# Patient Record
Sex: Male | Born: 1945 | Race: White | Hispanic: No | Marital: Married
Health system: Midwestern US, Community
[De-identification: ages and names within clinical notes are randomized; demographics above are authoritative.]

## PROBLEM LIST (undated history)

## (undated) DIAGNOSIS — K219 Gastro-esophageal reflux disease without esophagitis: Secondary | ICD-10-CM

## (undated) DIAGNOSIS — K2270 Barrett's esophagus without dysplasia: Secondary | ICD-10-CM

## (undated) DIAGNOSIS — Z8709 Personal history of other diseases of the respiratory system: Secondary | ICD-10-CM

## (undated) DIAGNOSIS — M1712 Unilateral primary osteoarthritis, left knee: Secondary | ICD-10-CM

## (undated) DIAGNOSIS — J40 Bronchitis, not specified as acute or chronic: Secondary | ICD-10-CM

## (undated) HISTORY — DX: Barrett's esophagus without dysplasia: K22.70

## (undated) HISTORY — PX: ESOPHAGOGASTRODUODENOSCOPY: SHX1529

## (undated) HISTORY — PX: HIP SURGERY: SHX245

## (undated) HISTORY — PX: COLONOSCOPY: SHX174

---

## 2001-07-28 ENCOUNTER — Encounter: Payer: Self-pay | Admitting: Orthopedic Surgery

## 2001-07-28 ENCOUNTER — Encounter: Admission: RE | Admit: 2001-07-28 | Discharge: 2001-07-28 | Payer: Self-pay | Admitting: Orthopedic Surgery

## 2001-11-19 HISTORY — PX: BACK SURGERY: SHX140

## 2002-07-27 ENCOUNTER — Observation Stay (HOSPITAL_COMMUNITY): Admission: RE | Admit: 2002-07-27 | Discharge: 2002-07-28 | Payer: Self-pay | Admitting: Orthopedic Surgery

## 2002-07-27 ENCOUNTER — Encounter: Payer: Self-pay | Admitting: Orthopedic Surgery

## 2002-07-28 ENCOUNTER — Encounter: Payer: Self-pay | Admitting: Orthopedic Surgery

## 2011-05-09 NOTE — Telephone Encounter (Signed)
Patient informed he may have Zostavax injection and script mailed to his home.

## 2011-08-16 NOTE — Patient Instructions (Signed)
Thank you for enrolling in MyChart. Please follow the instructions below to securely access your online medical record. MyChart allows you to send messages to your doctor, view your test results, renew your prescriptions, schedule appointments, and more.     How Do I Sign Up?  1. In your Internet browser, go to [email or web address]  2. Click on the Sign Up Now link in the Sign In box. You will see the New Member Sign Up page.  3. Enter your MyChart Access Code exactly as it appears below. You will not need to use this code after you've completed the sign-up process. If you do not sign up before the expiration date, you must request a new code.  MyChart Access Code: [email or web address]  Expires: 10/15/2011  9:22 AM    4. Enter your Social Security Number ([id number]) and Date of Birth (mm/dd/yyyy) as indicated and click Submit. You will be taken to the next sign-up page.  5. Create a MyChart ID. This will be your MyChart login ID and cannot be changed, so think of one that is secure and easy to remember.  6. Create a MyChart password. You can change your password at any time.  7. Enter your Password Reset Question and Answer. This can be used at a later time if you forget your password.   8. Enter your e-mail address. You will receive e-mail notification when new information is available in MyChart.  9. Click Sign Up. You can now view your medical record.     Additional Information  If you have questions, please contact your physician practice where you receive care. Remember, MyChart is NOT to be used for urgent needs. For medical emergencies, dial 911.

## 2011-08-16 NOTE — Progress Notes (Signed)
Subjective:      Patient ID: Wesley Holt is a 65 y.o. male.    HPIskin lesion left face for yr.  Wont heal    Review of Systems    Objective:   Physical Exam  ak of left face and one occiput, mult sk trunk  Assessment:      sk and ak      Plan:      Derm ref for whole body scan and rx of ak.  Avoid sun and hat etc, sun screen  Colon utd

## 2012-02-20 MED ORDER — PREDNISONE 20 MG PO TABS
20 MG | ORAL_TABLET | Freq: Every day | ORAL | Status: DC
Start: 2012-02-20 — End: 2012-12-19

## 2012-02-20 MED ORDER — ALBUTEROL SULFATE HFA 108 (90 BASE) MCG/ACT IN AERS
108 (90 Base) MCG/ACT | RESPIRATORY_TRACT | Status: DC | PRN
Start: 2012-02-20 — End: 2012-10-06

## 2012-02-20 NOTE — Progress Notes (Signed)
Subjective:      Patient ID: Wesley Holt is a 66 y.o. male.  Month hx  HPI    Review of Systems   Constitutional: Negative for fever and chills.   HENT: Positive for congestion, rhinorrhea and postnasal drip.    Respiratory: Positive for cough and wheezing. Negative for shortness of breath.    GERD is well controlled    Objective:   Physical Exam  HEENT is unremarkable supple lungs show x-ray wheeze with forced expiration no distress. Has multiple skin tags which are also  Assessment:        Bronchitis  GERD  Skin tags      Plan:      Cryo  See orders  Adacel  Requested old colonoscopy report N. date

## 2012-02-21 NOTE — Telephone Encounter (Signed)
[facility] received colonoscopy report  from dr Pattricia Boss from feb. 2005, and I  informed patient its due again feb. 2015.

## 2012-02-22 LAB — LIPID PANEL
Chol/HDL Ratio: 3.2
Cholesterol, Total: 188
HDL: 59 mg/dL (ref 35–70)
LDL Calculated: 117 mg/dL (ref 0–160)
Triglycerides: 59 mg/dL
VLDL: 12 mg/dL

## 2012-02-22 LAB — PSA SCREENING: PSA: 1.2 ng/mL

## 2012-02-28 NOTE — Telephone Encounter (Signed)
Informed wife labs good.

## 2012-03-06 NOTE — Telephone Encounter (Signed)
Changes made

## 2012-03-06 NOTE — Telephone Encounter (Addendum)
Please abstract to patient's chart he had Zostavax vaccine in November 2012 at [facility] on [address] in [place].  Thanks!

## 2012-10-07 MED ORDER — PROAIR HFA 108 (90 BASE) MCG/ACT IN AERS
108 (90 Base) MCG/ACT | RESPIRATORY_TRACT | Status: DC
Start: 2012-10-07 — End: 2013-03-11

## 2012-12-19 MED ORDER — PREDNISONE 20 MG PO TABS
20 MG | ORAL_TABLET | Freq: Every day | ORAL | Status: DC
Start: 2012-12-19 — End: 2013-03-13

## 2012-12-19 NOTE — Progress Notes (Signed)
Subjective:      Patient ID: Wesley Holt is a 67 y.o. male.    HPI Comments: Feels he has bronchitis. AM airway clearing,     Wheezing   Associated symptoms include coughing. Pertinent negatives include no chest pain, chills, diarrhea, ear pain, fever, neck pain, shortness of breath or sore throat.   Other  Associated symptoms include congestion and coughing. Pertinent negatives include no arthralgias, chest pain, chills, diaphoresis, fatigue, fever, nausea, neck pain or sore throat.       Review of Systems   Constitutional: Negative for fever, chills, diaphoresis and fatigue.   HENT: Positive for congestion and postnasal drip. Negative for ear pain, sore throat, trouble swallowing, neck pain, neck stiffness, voice change and sinus pressure.    Eyes: Negative for visual disturbance.   Respiratory: Positive for cough and wheezing. Negative for shortness of breath.    Cardiovascular: Negative for chest pain and leg swelling.   Gastrointestinal: Negative for nausea and diarrhea.   Musculoskeletal: Negative for back pain, arthralgias and gait problem.   Psychiatric/Behavioral: Positive for sleep disturbance.       Objective:   Physical Exam   Constitutional: He is oriented to person, place, and time. He appears well-developed and well-nourished. No distress.   HENT:   Head: Normocephalic and atraumatic.   Mouth/Throat: No oropharyngeal exudate.   Eyes: No scleral icterus.   Neck: Neck supple.   Cardiovascular: Exam reveals no gallop and no friction rub.    No murmur heard.  Pulmonary/Chest: Breath sounds normal. He exhibits no tenderness.   Musculoskeletal: He exhibits no edema and no tenderness.   Lymphadenopathy:     He has no cervical adenopathy.   Neurological: He is alert and oriented to person, place, and time. No cranial nerve deficit.   Skin: No rash noted. He is not diaphoretic. No erythema.   Psychiatric: He has a normal mood and affect. His behavior is normal. Judgment and thought content normal.        Assessment:      RAD       Plan:      Prednisone taper.

## 2012-12-19 NOTE — Progress Notes (Signed)
Have you seen any other physician or provider since your last visit? - no    Have you had any other diagnostic tests since your last visit? -  no    Have you changed or stopped any medications since your last visit including any over-the-counter medicines, vitamins, or herbal medicines? -  no     Are you taking all your prescribed medications? -  Yes  If NO, why? - N/A    Patient Self-Management Goal for this visit    What is your goal for your visit today: see if he has bronchitis   Barriers to success: none   Plan for overcoming my barriers: N/A      Confidence: 10/10   Date goal set: 12/19/2012   Date expected to reach goal:  today  HPI Notes

## 2012-12-19 NOTE — Addendum Note (Signed)
Addended by: Pamalee Leyden on: 12/19/2012 03:16 PM     Modules accepted: Orders

## 2013-03-11 MED ORDER — ALBUTEROL SULFATE HFA 108 (90 BASE) MCG/ACT IN AERS
108 (90 Base) MCG/ACT | RESPIRATORY_TRACT | Status: AC
Start: 2013-03-11 — End: ?

## 2013-03-13 ENCOUNTER — Encounter

## 2013-03-13 MED ORDER — PREDNISONE 20 MG PO TABS
20 MG | ORAL_TABLET | Freq: Every day | ORAL | Status: DC
Start: 2013-03-13 — End: 2014-08-27

## 2013-03-13 NOTE — Telephone Encounter (Signed)
Done,  Use proventil too

## 2013-03-13 NOTE — Telephone Encounter (Signed)
Message left on patient recorder

## 2013-03-13 NOTE — Telephone Encounter (Signed)
Chest congestion, bronchial irritation and tightness, shortness of breath, wheezing, coughing up and blowing out some clear mucus.  Symptoms started 3 weeks ago following an URI.  He had the same symptoms on 12-19-12 when he saw Dr. Smitty CordsBruce.  Prednisone was prescribed then and it really helped him.  Can he get a refill on this?    [facility]

## 2013-03-16 MED ORDER — DOXYCYCLINE HYCLATE 100 MG PO TABS
100 MG | ORAL_TABLET | Freq: Two times a day (BID) | ORAL | Status: AC
Start: 2013-03-16 — End: 2013-03-26

## 2013-03-16 NOTE — Progress Notes (Signed)
Subjective:      Patient ID: Wesley Holt is a 67 y.o. male.    Other  This is a new problem. The current episode started 1 to 4 weeks ago. The problem occurs constantly. Associated symptoms include congestion and coughing. Pertinent negatives include no chills or fever. Treatments tried: pred. The treatment provided mild relief.       Review of Systems   Constitutional: Negative for fever and chills.   HENT: Positive for congestion, rhinorrhea and postnasal drip.    Respiratory: Positive for cough, shortness of breath and wheezing.    Gastrointestinal: Negative.        Objective:   Physical Exam   Constitutional: He appears well-nourished.   HENT:   Head: Normocephalic and atraumatic.   Right Ear: External ear normal.   Left Ear: External ear normal.   Mouth/Throat: Oropharynx is clear and moist. No oropharyngeal exudate.   Neck: Neck supple.   Cardiovascular: Normal rate, normal heart sounds and intact distal pulses.  Exam reveals no gallop.    No murmur heard.  Pulmonary/Chest: Effort normal and breath sounds normal. No respiratory distress. He has no wheezes. He has no rales. He exhibits no tenderness.   Musculoskeletal: He exhibits no edema and no tenderness.       Assessment:      Asthmatic bronchitis       Plan:      Chest x-ray see orders  Because of recurrent episodes referred for allergy consult, f/ut with me as needed

## 2013-03-16 NOTE — Progress Notes (Signed)
Have you seen any other physician or provider since your last visit no    Have you had any other diagnostic tests since your last visit? no    Have you changed or stopped any medications since your last visit including any over-the-counter medicines, vitamins, or herbal medicines? no     Are you taking all your prescribed medications? Yes  If NO, why? -  N/A           Patient Self-Management Goal for this visit.   What is your goal for your visit today? - feel better   Barriers to success: none   Plan for overcoming my barriers: N/A      Confidence: 8/10   Date goal set: 03/16/2013   Date expected to reach goal: unsure    Health Maintenance Due   Topic Date Due   ??? Psa Counseling  02/21/2013

## 2013-03-17 ENCOUNTER — Encounter

## 2014-08-27 ENCOUNTER — Ambulatory Visit: Admit: 2014-08-27 | Discharge: 2014-08-27 | Payer: PRIVATE HEALTH INSURANCE | Attending: Family Medicine

## 2014-08-27 DIAGNOSIS — H1131 Conjunctival hemorrhage, right eye: Secondary | ICD-10-CM

## 2014-08-27 NOTE — Progress Notes (Signed)
Subjective:      Patient ID: Wesley HugueninRobert Matthew Holt is a 68 y.o. male.    HPI Comments: Have you seen any other physician or provider since your last visit no    Have you had any other diagnostic tests since your last visit? no    Have you changed or stopped any medications since your last visit including any over-the-counter medicines, vitamins, or herbal medicines? no     Are you taking all your prescribed medications? Yes  If NO, why? -  N/A           Patient Self-Management Goal for this visit.   What is your goal for your visit today? - sx relief   Barriers to success: none   Plan for overcoming my barriers: N/A      Confidence: 10/10   Date goal set: 08/27/14   Date expected to reach goal: 1day    No past medical history on file.  No past surgical history on file.  No family history on file.      Smoking Status: Former Smoker                   Packs/Day: 0.00  Years:            Quit date: 04/20/1973    Smokeless Status: Never Used                        Alcohol Use: Not on file     Current Outpatient Prescriptions:  albuterol (PROAIR HFA) 108 (90 BASE) MCG/ACT inhaler, INHALE TWO PUFFS BY MOUTH EVERY 4 HOURS AS NEEDED FOR WHEEZING, Disp: 8.5 g, Rfl: 5  Omeprazole Magnesium (PRILOSEC OTC PO), Take 20 mg by mouth daily.  , Disp: , Rfl:     No current facility-administered medications for this visit.    No Known Allergies      Pt presents with concerns over red OD.      Eye Problem   Associated symptoms include eye redness. Pertinent negatives include no eye discharge, fever or weakness.   Headache   Associated symptoms include eye redness. Pertinent negatives include no abdominal pain, back pain, coughing, eye pain, fever, hearing loss, neck pain, numbness or weakness.       Review of Systems   Constitutional: Negative for fever, chills, diaphoresis and fatigue.   HENT: Negative for congestion and hearing loss.    Eyes: Positive for redness. Negative for pain, discharge, itching and visual disturbance.    Respiratory: Negative for cough, shortness of breath and wheezing.    Cardiovascular: Negative for chest pain, palpitations and leg swelling.   Gastrointestinal: Negative for abdominal pain, diarrhea, constipation and blood in stool.   Genitourinary: Negative for dysuria.   Musculoskeletal: Negative for back pain, arthralgias, gait problem and neck pain.   Skin: Negative for rash.   Neurological: Positive for headaches. Negative for weakness and numbness.   Psychiatric/Behavioral: Negative for sleep disturbance and dysphoric mood.       Objective:   Physical Exam   Constitutional: He is oriented to person, place, and time. He appears well-developed and well-nourished. No distress.   HENT:   Head: Normocephalic and atraumatic.   Mouth/Throat: No oropharyngeal exudate.   Eyes: Right eye exhibits no discharge. Left eye exhibits no discharge. Right conjunctiva is not injected. Right conjunctiva has a hemorrhage. Left conjunctiva is not injected. Left conjunctiva has no hemorrhage. No scleral icterus.   Neck: Neck supple. Carotid  bruit is not present. No thyromegaly present.   Cardiovascular: Normal rate, regular rhythm and normal heart sounds.  Exam reveals no gallop and no friction rub.    No murmur heard.  Pulmonary/Chest: Breath sounds normal. No respiratory distress. He has no wheezes. He has no rales. He exhibits no tenderness.   Abdominal: There is no tenderness.   Musculoskeletal: He exhibits no edema or tenderness.   Lymphadenopathy:     He has no cervical adenopathy.   Neurological: He is alert and oriented to person, place, and time. No cranial nerve deficit. Coordination normal.   Skin: No rash noted. He is not diaphoretic.   Psychiatric: He has a normal mood and affect. His behavior is normal. Judgment and thought content normal.       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       Plan:      Reassurance  F/U prn.

## 2014-09-16 ENCOUNTER — Ambulatory Visit (INDEPENDENT_AMBULATORY_CARE_PROVIDER_SITE_OTHER): Payer: No Typology Code available for payment source

## 2014-09-16 VITALS — BP 135/96 | HR 92 | Resp 12

## 2014-09-16 DIAGNOSIS — M7750 Other enthesopathy of unspecified foot: Secondary | ICD-10-CM

## 2014-09-16 DIAGNOSIS — B351 Tinea unguium: Secondary | ICD-10-CM

## 2014-09-16 DIAGNOSIS — Q828 Other specified congenital malformations of skin: Secondary | ICD-10-CM

## 2014-09-16 DIAGNOSIS — M79673 Pain in unspecified foot: Secondary | ICD-10-CM

## 2014-09-16 DIAGNOSIS — M2040 Other hammer toe(s) (acquired), unspecified foot: Secondary | ICD-10-CM

## 2014-09-16 DIAGNOSIS — M779 Enthesopathy, unspecified: Secondary | ICD-10-CM

## 2014-09-16 DIAGNOSIS — M778 Other enthesopathies, not elsewhere classified: Secondary | ICD-10-CM

## 2014-09-16 DIAGNOSIS — M2010 Hallux valgus (acquired), unspecified foot: Secondary | ICD-10-CM

## 2014-09-16 DIAGNOSIS — B353 Tinea pedis: Secondary | ICD-10-CM

## 2014-09-16 MED ORDER — KETOCONAZOLE 2 % EX CREA: 1.0000 "application " | TOPICAL_CREAM | Freq: Two times a day (BID) | CUTANEOUS | Status: DC

## 2014-09-16 NOTE — Progress Notes (Signed)
Subjective:    Patient ID: Carolyne LittlesRobert H Whitsell, male    DOB: Apr 04, 1946, 68 y.o.   MRN: [id number]  HPI PT STATED RT 2ND TOE IS SORE, HAVE THICK SKIN AND BEEN LIKE THAT FOR 15 YEARS. THE TOE IS GETTING WORSE AND GET AGGRAVATED BY PRESSURE.  TRIED NO TREATMENT.  ALSO, RT BOTTOM OF THE FOOT HAVE CALLUS.   Review of Systems  Musculoskeletal: Positive for gait problem.  All other systems reviewed and are negative.      Objective:   Physical Exam 68 year old white male well-developed well-nourished oriented 3 presents at this time with painful skin lesions particularly distal second digit right foot and subsecond MTP area right foot been there for many years has severe rigid contractures of toes 1 through 5 bilateral right more severe than left with notable HAV deformity and hallux malleus deformity as well as claw toe/hammertoe type deformities 2 through 5 bilateral right more so than left. No handwritten history of injury trauma patient's father also had severe deformities of both feet. Patient does have hemorrhagic keratoses distal clavus second right and hemorrhage keratoses subsecond MTP plantarly on the right. Patient also has diffuse keratoses sub-1 and 5 bilateral secondary to HAV deformity and digital contractures. Lower extremity objective findings as follows vascular status is intact pedal pulses palpable DP +2 over 4 bilateral PT 1 over 4 bilateral mild +1 edema noted minimal or no varicosity decreased hair growth distally although intact to the toes. Skin temperature warm turgor normal no edema or mild edema noted no rubor or pallor or varicosity noted neurologic epicritic and proprioceptive sensations intact and symmetric bilateral there is normal plantar response DTRs not listed dermatologic the skin color pigment normal hair growth diminished distally nails criptotic friable discolored yellow and brittle 1 through 5 bilateral with thickening discoloration friability also interdigital  fissuring maceration second third and fourth interspaces both feet there is history of peripheral pruritus and some fissuring noted patient also has some oxygen distribution of tinea right foot more so than left consistent with chronic tinea pedis as well as the fungal discussed discoloration of nails. Orthopedic biomechanical exam again reveals relatively rectus to higher arch foot type with claw toe contractures rigid of the toes as well as hallux abductovalgus deformity and bunion deformity with rigid contracture of the hallux as well. Comminuted shoes does work in on his feet does not driving and standing at times and indicates he has been putting this off for a long time. Remainder the exam is unremarkable and noncontributory does have a history of back surgery 10 years ago however that is this foot problems were present prior to the back issue. 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 May also be candidate for future therapies and treatments however we'll initiate continue  treatment at this time. Maintaining appropriate comminuted shoes patient is advised that surgery would be required to correct the deformities of the foot and teasing certainly has some arthrosis and some  stiffness and abnormal function this would not restore him to a normal foot however more structurally stable foot would be beneficial. Follow-up in one month for further consult   f Alvan Dameichard Sheela Mcculley DPM

## 2014-09-16 NOTE — Patient Instructions (Signed)
Athlete's Foot Athlete's foot (tinea pedis) is a fungal infection of the skin on the feet. It often occurs on the skin between the toes or underneath the toes. It can also occur on the soles of the feet. Athlete's foot is more likely to occur in hot, humid weather. Not washing your feet or changing your socks often enough can contribute to athlete's foot. The infection can spread from person to person (contagious). CAUSES Athlete's foot is caused by a fungus. This fungus thrives in warm, moist places. Most people get athlete's foot by sharing shower stalls, towels, and wet floors with an infected person. People with weakened immune systems, including those with diabetes, may be more likely to get athlete's foot. SYMPTOMS   Itchy areas between the toes or on the soles of the feet.  White, flaky, or scaly areas between the toes or on the soles of the feet.  Tiny, intensely itchy blisters between the toes or on the soles of the feet.  Tiny cuts on the skin. These cuts can develop a bacterial infection.  Thick or discolored toenails. DIAGNOSIS  Your caregiver can usually tell what the problem is by doing a physical exam. Your caregiver may also take a skin sample from the rash area. The skin sample may be examined under a microscope, or it may be tested to see if fungus will grow in the sample. A sample may also be taken from your toenail for testing. TREATMENT  Over-the-counter and prescription medicines can be used to kill the fungus. These medicines are available as powders or creams. Your caregiver can suggest medicines for you. Fungal infections respond slowly to treatment. You may need to continue using your medicine for several weeks. PREVENTION   Do not share towels.  Wear sandals in wet areas, such as shared locker rooms and shared showers.  Keep your feet dry. Wear shoes that allow air to circulate. Wear cotton or wool socks. HOME CARE INSTRUCTIONS   Take medicines as directed by  your caregiver. Do not use steroid creams on athlete's foot.  Keep your feet clean and cool. Wash your feet daily and dry them thoroughly, especially between your toes.  Change your socks every day. Wear cotton or wool socks. In hot climates, you may need to change your socks 2 to 3 times per day.  Wear sandals or canvas tennis shoes with good air circulation.  If you have blisters, soak your feet in Burow's solution or Epsom salts for 20 to 30 minutes, 2 times a day to dry out the blisters. Make sure you dry your feet thoroughly afterward. SEEK MEDICAL CARE IF:   You have a fever.  You have swelling, soreness, warmth, or redness in your foot.  You are not getting better after 7 days of treatment.  You are not completely cured after 30 days.  You have any problems caused by your medicines. MAKE SURE YOU:   Understand these instructions.  Will watch your condition.  Will get help right away if you are not doing well or get worse. Document Released: 11/02/2000 Document Revised: 01/28/2012 Document Reviewed: 08/24/2011 [facility] Patient Information 2015 [employer] [place]. This information is not intended to replace advice given to you by your health care provider. Make sure you discuss any questions you have with your health care provider.  Apply the antifungal cream that was prescribed between all the toes and the entire bottom surface of the foot bilateral or both feet twice daily for at least 1 month

## 2014-10-11 ENCOUNTER — Ambulatory Visit (INDEPENDENT_AMBULATORY_CARE_PROVIDER_SITE_OTHER): Payer: No Typology Code available for payment source

## 2014-10-11 VITALS — BP 148/87 | HR 70 | Resp 12

## 2014-10-11 DIAGNOSIS — M779 Enthesopathy, unspecified: Secondary | ICD-10-CM

## 2014-10-11 DIAGNOSIS — M2010 Hallux valgus (acquired), unspecified foot: Secondary | ICD-10-CM

## 2014-10-11 DIAGNOSIS — Q828 Other specified congenital malformations of skin: Secondary | ICD-10-CM

## 2014-10-11 DIAGNOSIS — M7750 Other enthesopathy of unspecified foot: Secondary | ICD-10-CM

## 2014-10-11 DIAGNOSIS — M2040 Other hammer toe(s) (acquired), unspecified foot: Secondary | ICD-10-CM

## 2014-10-11 DIAGNOSIS — M778 Other enthesopathies, not elsewhere classified: Secondary | ICD-10-CM

## 2014-10-11 DIAGNOSIS — M79673 Pain in unspecified foot: Secondary | ICD-10-CM

## 2014-10-11 NOTE — Patient Instructions (Signed)
Bunionectomy A bunionectomy is surgery to remove a bunion. A bunion is an enlargement of the joint at the base of the big toe. It is made up of bone and soft tissue on the inside part of the joint. Over time, a painful lump appears on the inside of the joint. The big toe begins to point inward toward the second toe. New bone growth can occur and a bone spur may form. The pain eventually causes difficulty walking. A bunion usually results from inflammation caused by the irritation of poorly fitting shoes. It often begins later in life. A bunionectomy is performed when nonsurgical treatment no longer works. When surgery is needed, the extent of the procedure will depend on the degree of deformity of the foot. Your surgeon will discuss with you the different procedures and what will work best for you depending on your age and health. LET YOUR CAREGIVER KNOW ABOUT:   Previous problems with anesthetics or medicines used to numb the skin.  Allergies to dyes, iodine, foods, and/or latex.  Medicines taken including herbs, eye drops, prescription medicines (especially medicines used to "thin the blood"), aspirin and other over-the-counter medicines, and steroids (by mouth or as a cream).  History of bleeding or blood problems.  Possibility of pregnancy, if this applies.  History of blood clots in your legs and/or lungs .  Previous surgery.  Other important health problems. RISKS AND COMPLICATIONS   Infection.  Pain.  Nerve damage.  Possibility that the bunion will recur. BEFORE THE PROCEDURE  You should be present 60 minutes prior to your procedure or as directed.  PROCEDURE  Surgery is often done so that you can go home the same day (outpatient). It may be done in a hospital or in an outpatient surgical center. An anesthetic will be used to help you sleep during the procedure. Sometimes, a spinal anesthetic is used to make you numb below the waist. A cut (incision) is made over the swollen  area at the first joint of the big toe. The enlarged lump will be removed. If there is a need to reposition the bones of the big toe, this may require more than 1 incision. The bone itself may need to be cut. Screws and wires may be used in the repair. These can be removed at a later date. In severe cases, the entire joint may need to be removed and a joint replacement inserted. When done, the incision is closed with stitches (sutures). Skin adhesive strips may be added for reinforcement. They help hold the incision closed.  AFTER THE PROCEDURE  Compression bandages (dressings) are then wrapped around the wound. This helps to keep the foot in alignment and reduce swelling. Your foot will be monitored for bleeding and swelling. You will need to stay for a few hours in the recovery area before being discharged. This allows time for the anesthesia to wear off. You will be discharged home when you are awake, stable, and doing well. HOME CARE INSTRUCTIONS   You can expect to return to normal activities within 6 to 8 weeks after surgery. The foot is at increased risk for swelling for several months. When you can expect to bear weight on the operated foot will depend on the extent of your surgery. The milder the deformity, the less tissue is removed and the sooner the return to normal activity level. During the recovery period, a special shoe, boot, or cast may be worn to accommodate the surgical bandage and to help provide stability   to the foot.  Once you are home, an ice pack applied to the operative site may help with discomfort and keep swelling down. Stop using the ice if it causes discomfort.  Keep your feet raised (elevated) when possible to lessen swelling.  If you have an elastic bandage on your foot and you have numbness, tingling, or your foot becomes cold and blue, adjust the bandage to make it comfortable.  Change dressings as directed.  Keep the wound dry and clean. The wound may be washed  gently with soap and water. Gently blot dry without rubbing. Do not take baths or use swimming pools or hot tubs for 10 days, or as instructed by your caregiver.  Only take over-the-counter or prescription medicines for pain, discomfort, or fever as directed by your caregiver.  You may continue a normal diet as directed.  For activity, use crutches with no weight bearing or your orthopedic shoe as directed. Continue to use crutches or a cane as directed until you can stand without causing pain. SEEK MEDICAL CARE IF:   You have redness, swelling, bruising, or increasing pain in the wound.  There is pus coming from the wound.  You have drainage from a wound lasting longer than 1 day.  You have an oral temperature above 102 F (38.9 C).  You notice a bad smell coming from the wound or dressing.  The wound breaks open after sutures have been removed.  You develop dizzy episodes or fainting while standing.  You have persistent nausea or vomiting.  Your toes become cold.  Pain is not relieved with medicines. SEEK IMMEDIATE MEDICAL CARE IF:   You develop a rash.  You have difficulty breathing.  You develop any reaction or side effects to medicines given.  Your toes are numb or blue, or you have severe pain. MAKE SURE YOU:   Understand these instructions.  Will watch your condition.  Will get help right away if you are not doing well or get worse. Document Released: 10/19/2005 Document Revised: 01/28/2012 Document Reviewed: 11/24/2007 ExitCare Patient Information 2015 [employer]. This information is not intended to replace advice given to you by your health care provider. Make sure you discuss any questions you have with your health care provider.  

## 2014-10-11 NOTE — Progress Notes (Signed)
   Subjective:    Patient ID: Nicholas LittlesRobert H Shimmin, male    DOB: [birth year], 68 y.o.   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 5 there is keratoses subsecond right which is debrided at this time no open wound ulcer site punctate keratoses identified with dried hemorrhage a keratoses noted patient also has distal clavus second right which is resolved no open wounds no ulcers no signs of infection patient's had improvement with the use of the tube foam pads cushions this time dispensed literature about surgical intervention for bunion and hammertoe repair patient will consider this at some point in the future would be good candidate for likely bunionectomy possible hallux IP fusion as well as hammertoe repairs 2 through 5. We briefly discussed the risks complications of surgery the risks of not doing surgery with further breakdown and difficulties with gait and ambulation. 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 Dispensed literature about surgical options will follow-up in the future on an as-needed basis for surgery consult when ready. In the interim maintain accommodative thick soled shoes such as new balance or Brooks. Dispensed additional tube foam padding for the second toe at this time. Follow-up in the future when ready to consider surgical option  Alvan Dameichard Levon Penning DPM

## 2016-01-05 DIAGNOSIS — K219 Gastro-esophageal reflux disease without esophagitis: Secondary | ICD-10-CM | POA: Diagnosis not present

## 2016-01-05 DIAGNOSIS — K2270 Barrett's esophagus without dysplasia: Secondary | ICD-10-CM | POA: Diagnosis not present

## 2016-01-25 ENCOUNTER — Other Ambulatory Visit: Payer: Self-pay

## 2016-01-25 DIAGNOSIS — K2900 Acute gastritis without bleeding: Secondary | ICD-10-CM | POA: Diagnosis not present

## 2016-01-25 DIAGNOSIS — K2950 Unspecified chronic gastritis without bleeding: Secondary | ICD-10-CM | POA: Diagnosis not present

## 2016-01-25 DIAGNOSIS — K2270 Barrett's esophagus without dysplasia: Secondary | ICD-10-CM | POA: Diagnosis not present

## 2016-01-25 DIAGNOSIS — Z791 Long term (current) use of non-steroidal anti-inflammatories (NSAID): Secondary | ICD-10-CM | POA: Diagnosis not present

## 2016-04-30 DIAGNOSIS — Z0000 Encounter for general adult medical examination without abnormal findings: Secondary | ICD-10-CM | POA: Diagnosis not present

## 2016-05-07 DIAGNOSIS — Z0000 Encounter for general adult medical examination without abnormal findings: Secondary | ICD-10-CM | POA: Diagnosis not present

## 2016-05-07 DIAGNOSIS — D519 Vitamin B12 deficiency anemia, unspecified: Secondary | ICD-10-CM | POA: Diagnosis not present

## 2016-06-06 DIAGNOSIS — D510 Vitamin B12 deficiency anemia due to intrinsic factor deficiency: Secondary | ICD-10-CM | POA: Diagnosis not present

## 2016-08-02 DIAGNOSIS — M25462 Effusion, left knee: Secondary | ICD-10-CM | POA: Diagnosis not present

## 2016-08-02 DIAGNOSIS — M1712 Unilateral primary osteoarthritis, left knee: Secondary | ICD-10-CM | POA: Diagnosis not present

## 2016-08-02 DIAGNOSIS — M25561 Pain in right knee: Secondary | ICD-10-CM | POA: Diagnosis not present

## 2016-08-02 DIAGNOSIS — M25562 Pain in left knee: Secondary | ICD-10-CM | POA: Diagnosis not present

## 2016-08-02 DIAGNOSIS — M85862 Other specified disorders of bone density and structure, left lower leg: Secondary | ICD-10-CM | POA: Diagnosis not present

## 2016-08-02 DIAGNOSIS — G8929 Other chronic pain: Secondary | ICD-10-CM | POA: Diagnosis not present

## 2017-06-06 DIAGNOSIS — Z6838 Body mass index (BMI) 38.0-38.9, adult: Secondary | ICD-10-CM | POA: Diagnosis not present

## 2017-06-06 DIAGNOSIS — Z9181 History of falling: Secondary | ICD-10-CM | POA: Diagnosis not present

## 2017-06-06 DIAGNOSIS — Z1389 Encounter for screening for other disorder: Secondary | ICD-10-CM | POA: Diagnosis not present

## 2017-06-06 DIAGNOSIS — Z0000 Encounter for general adult medical examination without abnormal findings: Secondary | ICD-10-CM | POA: Diagnosis not present

## 2017-06-06 DIAGNOSIS — Z79899 Other long term (current) drug therapy: Secondary | ICD-10-CM | POA: Diagnosis not present

## 2017-06-06 DIAGNOSIS — E785 Hyperlipidemia, unspecified: Secondary | ICD-10-CM | POA: Diagnosis not present

## 2017-06-06 DIAGNOSIS — D519 Vitamin B12 deficiency anemia, unspecified: Secondary | ICD-10-CM | POA: Diagnosis not present

## 2017-07-10 DIAGNOSIS — D510 Vitamin B12 deficiency anemia due to intrinsic factor deficiency: Secondary | ICD-10-CM | POA: Diagnosis not present

## 2017-08-13 DIAGNOSIS — D510 Vitamin B12 deficiency anemia due to intrinsic factor deficiency: Secondary | ICD-10-CM | POA: Diagnosis not present

## 2017-08-15 DIAGNOSIS — M25561 Pain in right knee: Secondary | ICD-10-CM

## 2017-08-15 DIAGNOSIS — M170 Bilateral primary osteoarthritis of knee: Secondary | ICD-10-CM | POA: Diagnosis not present

## 2017-08-15 DIAGNOSIS — G8929 Other chronic pain: Secondary | ICD-10-CM | POA: Diagnosis not present

## 2017-08-15 DIAGNOSIS — M25562 Pain in left knee: Secondary | ICD-10-CM

## 2017-08-28 DIAGNOSIS — R0602 Shortness of breath: Secondary | ICD-10-CM

## 2017-08-28 DIAGNOSIS — I10 Essential (primary) hypertension: Secondary | ICD-10-CM

## 2017-08-28 DIAGNOSIS — I951 Orthostatic hypotension: Secondary | ICD-10-CM

## 2017-08-28 DIAGNOSIS — E7800 Pure hypercholesterolemia, unspecified: Secondary | ICD-10-CM

## 2017-08-28 DIAGNOSIS — R002 Palpitations: Secondary | ICD-10-CM

## 2017-08-28 DIAGNOSIS — R42 Dizziness and giddiness: Secondary | ICD-10-CM

## 2017-08-28 DIAGNOSIS — K22719 Barrett's esophagus with dysplasia, unspecified: Secondary | ICD-10-CM

## 2017-08-28 DIAGNOSIS — D510 Vitamin B12 deficiency anemia due to intrinsic factor deficiency: Secondary | ICD-10-CM

## 2017-08-29 DIAGNOSIS — K2270 Barrett's esophagus without dysplasia: Secondary | ICD-10-CM | POA: Insufficient documentation

## 2017-08-29 DIAGNOSIS — E7800 Pure hypercholesterolemia, unspecified: Secondary | ICD-10-CM | POA: Insufficient documentation

## 2017-08-29 DIAGNOSIS — D510 Vitamin B12 deficiency anemia due to intrinsic factor deficiency: Secondary | ICD-10-CM | POA: Insufficient documentation

## 2017-08-30 ENCOUNTER — Ambulatory Visit (INDEPENDENT_AMBULATORY_CARE_PROVIDER_SITE_OTHER): Payer: PPO | Admitting: Cardiology

## 2017-08-30 ENCOUNTER — Encounter: Payer: Self-pay | Admitting: Cardiology

## 2017-08-30 VITALS — BP 126/84 | HR 56 | Resp 14 | Ht 76.0 in | Wt 312.8 lb

## 2017-08-30 DIAGNOSIS — R079 Chest pain, unspecified: Secondary | ICD-10-CM | POA: Diagnosis not present

## 2017-08-30 DIAGNOSIS — E785 Hyperlipidemia, unspecified: Secondary | ICD-10-CM

## 2017-08-30 DIAGNOSIS — R0789 Other chest pain: Secondary | ICD-10-CM

## 2017-08-30 DIAGNOSIS — D510 Vitamin B12 deficiency anemia due to intrinsic factor deficiency: Secondary | ICD-10-CM | POA: Diagnosis not present

## 2017-08-30 NOTE — Progress Notes (Signed)
Cardiology Consultation:    Date:  08/30/2017   ID:  Nicholas Rowland, DOB May 02, 1946, MRN [id number]  PCP:  Noni Saupe, MD  Cardiologist:  Gypsy Balsam, MD   Referring MD: Noni Saupe, MD   Chief Complaint  Patient presents with  . Pre-op Exam  I need knee replacement surgery  History of Present Illness:    Nicholas Rowland is a 71 y.o. male who is being seen today for the evaluation of Multiple risk factors for coronary artery disease at the request of Noni Saupe, MD. Patient required left knee replacement surgery. He does have multiple resources for coronary artery disease namely elevated cholesterol, he refused anticholesterol medication, borderline hypertension, also recent laboratory tests show elevated glucose he's telling me that he was fasting. He does have history of smoking. He is a bit of exercise is limited because of knee pain. Denies having any typical tightness squeezing pressure burning chest assisted with exertion but does have some uneasy sensation sometimes lasting for few minutes.  Past Medical History:  Diagnosis Date  . Barrett esophagus     Past Surgical History:  Procedure Laterality Date  . BACK SURGERY      Current Medications: Current Meds  Medication Sig  . pantoprazole (PROTONIX) 40 MG tablet Take 40 mg by mouth daily.     Allergies:   Patient has no known allergies.   Social History   Social History  . Marital status: Married    Spouse name: N/A  . Number of children: N/A  . Years of education: N/A   Social History Main Topics  . Smoking status: Former Games developer  . Smokeless tobacco: Never Used  . Alcohol use No  . Drug use: No  . Sexual activity: Not Asked   Other Topics Concern  . None   Social History Narrative  . None     Family History: The patient's family history includes Barrett's esophagus in his brother; Breast cancer in his mother; Esophageal cancer in his brother. ROS:   Please see the  history of present illness.    All 14 point review of systems negative except as described per history of present illness.  EKGs/Labs/Other Studies Reviewed:    The following studies were reviewed today:  EKG from primary care physician showed normal sinus rhythm, normal. Interval, normal [employer] exertional Foley, no ST-T segment changes   Recent Labs: No results found for requested labs within last 8760 hours.  Recent Lipid Panel No results found for: CHOL, TRIG, HDL, CHOLHDL, VLDL, LDLCALC, LDLDIRECT  Physical Exam:    VS:  BP 126/84   Pulse (!) 56   Resp 14   Ht  (1.93 m)   Wt (!) 312 lb 12.8 oz (141.9 kg)   BMI 38.08 kg/m     Wt Readings from Last 3 Encounters:  08/30/17 (!) 312 lb 12.8 oz (141.9 kg)     GEN:  Well nourished, well developed in no acute distress HEENT: Normal NECK: No JVD; No carotid bruits LYMPHATICS: No lymphadenopathy CARDIAC: RRR, no murmurs, no rubs, no gallops RESPIRATORY:  Clear to auscultation without rales, wheezing or rhonchi  ABDOMEN: Soft, non-tender, non-distended MUSCULOSKELETAL:  No edema; No deformity  SKIN: Warm and dry NEUROLOGIC:  Alert and oriented x 3 PSYCHIATRIC:  Normal affect   ASSESSMENT:    1. Pernicious anemia   2. Atypical chest pain   3. Dyslipidemia    PLAN:    In  order of problems listed above:  1. Atypical chest pain, preop evaluation for knee replacement surgery under general anesthesia: He will require stress doses of success is risks. His ability to exercise is very limited, he does have significant risk factors for coronary artery disease. 2. Dyslipidemia: I offered him stopped and he declined. He said that he would like to lower his cholesterol on his own. I told him that this is a good idea. I also mentioned to him I doubt he will be able to get the target. Future recommendation will be made based on results of stress test. 3. Anemia: Followed by primary care physician.   Medication  Adjustments/Labs and Tests Ordered: Current medicines are reviewed at length with the patient today.  Concerns regarding medicines are outlined above.  No orders of the defined types were placed in this encounter.  No orders of the defined types were placed in this encounter.   Signed, Georgeanna Lea, MD, [facility]. 08/30/2017 11:31 AM    [facility]

## 2017-08-30 NOTE — Patient Instructions (Signed)
Medication Instructions:  Your physician recommends that you continue on your current medications as directed. Please refer to the Current Medication list given to you today.  Labwork: None  Testing/Procedures: Your physician has requested that you have a lexiscan myoview. For further information please visit [email or web address]. Please follow instruction sheet, as given.  Follow-Up: Your physician recommends that you schedule a follow-up appointment in: 3 months.  Any Other Special Instructions Will Be Listed Below (If Applicable).     If you need a refill on your cardiac medications before your next appointment, please call your pharmacy.   

## 2017-09-04 DIAGNOSIS — R0789 Other chest pain: Secondary | ICD-10-CM | POA: Diagnosis not present

## 2017-09-05 DIAGNOSIS — R0789 Other chest pain: Secondary | ICD-10-CM | POA: Diagnosis not present

## 2017-09-05 DIAGNOSIS — R079 Chest pain, unspecified: Secondary | ICD-10-CM | POA: Diagnosis not present

## 2017-09-10 DIAGNOSIS — D510 Vitamin B12 deficiency anemia due to intrinsic factor deficiency: Secondary | ICD-10-CM | POA: Diagnosis not present

## 2017-09-12 ENCOUNTER — Telehealth: Payer: Self-pay | Admitting: Cardiology

## 2017-09-12 NOTE — Telephone Encounter (Signed)
pts wife advised of results. Advised pt is clear for surgery.

## 2017-09-12 NOTE — Telephone Encounter (Signed)
Please call patient with results of Nulear stress and he needs "ok" to go have knee surgery..Marland Kitchen

## 2017-09-12 NOTE — Telephone Encounter (Signed)
Letter has been sent to PCP

## 2017-09-16 DIAGNOSIS — R7309 Other abnormal glucose: Secondary | ICD-10-CM | POA: Diagnosis not present

## 2017-09-16 DIAGNOSIS — E785 Hyperlipidemia, unspecified: Secondary | ICD-10-CM | POA: Diagnosis not present

## 2017-09-16 DIAGNOSIS — Z79899 Other long term (current) drug therapy: Secondary | ICD-10-CM | POA: Diagnosis not present

## 2017-09-19 ENCOUNTER — Other Ambulatory Visit: Payer: Self-pay | Admitting: Orthopedic Surgery

## 2017-10-18 DIAGNOSIS — D510 Vitamin B12 deficiency anemia due to intrinsic factor deficiency: Secondary | ICD-10-CM | POA: Diagnosis not present

## 2017-11-26 DIAGNOSIS — D510 Vitamin B12 deficiency anemia due to intrinsic factor deficiency: Secondary | ICD-10-CM | POA: Diagnosis not present

## 2017-11-29 NOTE — Pre-Procedure Instructions (Signed)
Nicholas LittlesRobert H Rowland  11/29/2017      [facility] [place] [address] [place] [address] Phone: [phone] Fax: [phone]    Your procedure is scheduled on 12/09/2017.  [facility] Admitting at [phone] A.M.  Call this number if you have problems the morning of surgery:  [phone]   Remember:  Do not eat food or drink liquids after midnight.   Take these medicines the morning of surgery with A SIP OF WATER: Pantoprazole (Protonix)  7 days prior to surgery STOP taking any Aspirin(unless otherwise instructed by your surgeon), Aleve, Naproxen, Ibuprofen, Motrin, Advil, Goody's, BC's, all herbal medications, fish oil, and all vitamins    Do not wear jewelry.  Do not wear lotions, powders, or colognes, or deodorant.  Men may shave face and neck.  Do not bring valuables to the hospital.  [facility] is not responsible for any belongings or valuables.  Hearing aids, eyeglasses, contacts, dentures or bridgework may not be worn into surgery.  Leave your suitcase in the car.  After surgery it may be brought to your room.  For patients admitted to the hospital, discharge time will be determined by your treatment team.  Patients discharged the day of surgery will not be allowed to drive home.   Name and phone number of your driver:    Special instructions:   [facility]- Preparing For Surgery  Before surgery, you can play an important role. Because skin is not sterile, your skin needs to be as free of germs as possible. You can reduce the number of germs on your skin by washing with CHG (chlorahexidine gluconate) Soap before surgery.  CHG is an antiseptic cleaner which kills germs and bonds with the skin to continue killing germs even after washing.  Please do not use if you have an allergy to CHG or antibacterial soaps. If your skin becomes reddened/irritated stop using the CHG.  Do not shave (including legs and  underarms) for at least 48 hours prior to first CHG shower. It is OK to shave your face.  Please follow these instructions carefully.   1. Shower the NIGHT BEFORE SURGERY and the MORNING OF SURGERY with CHG.   2. If you chose to wash your hair, wash your hair first as usual with your normal shampoo.  3. After you shampoo, rinse your hair and body thoroughly to remove the shampoo.  4. Use CHG as you would any other liquid soap. You can apply CHG directly to the skin and wash gently with a scrungie or a clean washcloth.   5. Apply the CHG Soap to your body ONLY FROM THE NECK DOWN.  Do not use on open wounds or open sores. Avoid contact with your eyes, ears, mouth and genitals (private parts). Wash Face and genitals (private parts)  with your normal soap.  6. Wash thoroughly, paying special attention to the area where your surgery will be performed.  7. Thoroughly rinse your body with warm water from the neck down.  8. DO NOT shower/wash with your normal soap after using and rinsing off the CHG Soap.  9. Pat yourself dry with a CLEAN TOWEL.  10. Wear CLEAN PAJAMAS to bed the night before surgery, wear comfortable clothes the morning of surgery  11. Place CLEAN SHEETS on your bed the night of your first shower and DO NOT SLEEP WITH PETS.    Day of Surgery: Shower  as stated above. Do not apply any deodorants/lotions. Please wear clean clothes to the hospital/surgery center.      Please read over the following fact sheets that you were given.

## 2017-12-02 ENCOUNTER — Encounter (HOSPITAL_COMMUNITY): Payer: Self-pay

## 2017-12-02 ENCOUNTER — Encounter (HOSPITAL_COMMUNITY)
Admission: RE | Admit: 2017-12-02 | Discharge: 2017-12-02 | Disposition: A | Discharge status: Home / Self Care | Payer: PPO | Source: Ambulatory Visit | Attending: Orthopedic Surgery | Admitting: Orthopedic Surgery

## 2017-12-02 ENCOUNTER — Other Ambulatory Visit: Payer: Self-pay

## 2017-12-02 DIAGNOSIS — K219 Gastro-esophageal reflux disease without esophagitis: Secondary | ICD-10-CM | POA: Insufficient documentation

## 2017-12-02 DIAGNOSIS — Z01812 Encounter for preprocedural laboratory examination: Secondary | ICD-10-CM | POA: Diagnosis not present

## 2017-12-02 DIAGNOSIS — Z87891 Personal history of nicotine dependence: Secondary | ICD-10-CM | POA: Insufficient documentation

## 2017-12-02 DIAGNOSIS — Z79899 Other long term (current) drug therapy: Secondary | ICD-10-CM | POA: Insufficient documentation

## 2017-12-02 HISTORY — DX: Gastro-esophageal reflux disease without esophagitis: K21.9

## 2017-12-02 HISTORY — DX: Personal history of other diseases of the respiratory system: Z87.09

## 2017-12-02 LAB — COMPREHENSIVE METABOLIC PANEL
ALT: 28 U/L (ref 17–63)
ANION GAP: 11 (ref 5–15)
AST: 30 U/L (ref 15–41)
Albumin: 3.8 g/dL (ref 3.5–5.0)
Alkaline Phosphatase: 64 U/L (ref 38–126)
BILIRUBIN TOTAL: 1.2 mg/dL (ref 0.3–1.2)
BUN: 16 mg/dL (ref 6–20)
CO2: 23 mmol/L (ref 22–32)
Calcium: 9.4 mg/dL (ref 8.9–10.3)
Chloride: 104 mmol/L (ref 101–111)
Creatinine, Ser: 0.7 mg/dL (ref 0.61–1.24)
GLUCOSE: 129 mg/dL — AB (ref 65–99)
Potassium: 4.2 mmol/L (ref 3.5–5.1)
Sodium: 138 mmol/L (ref 135–145)
TOTAL PROTEIN: 6.7 g/dL (ref 6.5–8.1)

## 2017-12-02 LAB — CBC WITH DIFFERENTIAL/PLATELET
BASOS PCT: 1 %
Basophils Absolute: 0.1 10*3/uL (ref 0.0–0.1)
Eosinophils Absolute: 0.1 10*3/uL (ref 0.0–0.7)
Eosinophils Relative: 2 %
HEMATOCRIT: 42.3 % (ref 39.0–52.0)
Hemoglobin: 14.5 g/dL (ref 13.0–17.0)
LYMPHS ABS: 1.2 10*3/uL (ref 0.7–4.0)
LYMPHS PCT: 18 %
MCH: 32 pg (ref 26.0–34.0)
MCHC: 34.3 g/dL (ref 30.0–36.0)
MCV: 93.4 fL (ref 78.0–100.0)
MONO ABS: 0.8 10*3/uL (ref 0.1–1.0)
MONOS PCT: 12 %
NEUTROS ABS: 4.5 10*3/uL (ref 1.7–7.7)
NEUTROS PCT: 67 %
Platelets: 215 10*3/uL (ref 150–400)
RBC: 4.53 MIL/uL (ref 4.22–5.81)
RDW: 12.9 % (ref 11.5–15.5)
WBC: 6.7 10*3/uL (ref 4.0–10.5)

## 2017-12-02 LAB — SURGICAL PCR SCREEN
MRSA, PCR: NEGATIVE
STAPHYLOCOCCUS AUREUS: NEGATIVE

## 2017-12-02 NOTE — Progress Notes (Signed)
   12/02/17 0907  OBSTRUCTIVE SLEEP APNEA  Have you ever been diagnosed with sleep apnea through a sleep study? No  Do you snore loudly (loud enough to be heard through closed doors)?  1  Do you often feel tired, fatigued, or sleepy during the daytime (such as falling asleep during driving or talking to someone)? 0  Has anyone observed you stop breathing during your sleep? 0  Do you have, or are you being treated for high blood pressure? 0  BMI more than 35 kg/m2? 1  Age > 50 (1-yes) 1  Neck circumference greater than:Male 16 inches or larger, Male 17inches or larger? 1  Male Gender (Yes=1) 1  Obstructive Sleep Apnea Score 5  Score 5 or greater  Results sent to PCP

## 2017-12-02 NOTE — Progress Notes (Addendum)
PCP - Gwendlyn DeutscherJohn Redding Cardiologist - denies  Chest x-ray - not needed EKG - 06/06/17 Stress Test - 09/03/17 ECHO - denies Cardiac Cath - denies  Sleep Study - denies Sleep Score send to PCP    Anesthesia review: yes ekg  Patient denies shortness of breath, fever, cough and chest pain at PAT appointment   Patient verbalized understanding of instructions that were given to them at the PAT appointment. Patient was also instructed that they will need to review over the PAT instructions again at home before surgery.

## 2017-12-03 NOTE — Progress Notes (Signed)
Anesthesia Chart Review:  Pt is a 72 year old male scheduled for L total knee arthroplasty on 12/09/2017 with Dannielle HuhSteve Lucey, MD  - PCP is Gwendlyn DeutscherJohn Redding, MD - Saw cardiologist Gypsy Balsamobert Krasowski, MD for pre-op eval 08/30/17. Stress test ordered, results below. Pt cleared for surgery  PMH includes:  GERD. Former smoker. BMI 38.5  Medications include: protonix  BP (!) 158/77   Pulse 70   Temp 36.7 C   Resp 20   Ht 6\' 4"  (1.93 m)   Wt (!) 316 lb 9.6 oz (143.6 kg)   SpO2 95%   BMI 38.54 kg/m   Preoperative labs reviewed.    EKG 06/06/17: Sinus rhythm with first-degree AV block  Nuclear stress test 09/03/17 [facility]; found in notes tab in Epic 09/06/17 "imaging"):  1.  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  No reversible ischemia is noted at this time 2.  Normal LV wall motion 3.  LVEF 70%. 4.  Noninvasive risk stratification: Low\  If no changes, I anticipate pt can proceed with surgery as scheduled.   Rica Mastngela Kairen Hallinan, FNP-BC [facility]Anesthesiology Phone: [phone] 12/03/2017 3:05 PM

## 2017-12-06 MED ORDER — CEFAZOLIN SODIUM 10 G IJ SOLR
3.0000 g | INTRAMUSCULAR | Status: AC
  Administered 2017-12-09: 3 g via INTRAVENOUS
  Filled 2017-12-06: qty 3

## 2017-12-06 MED ORDER — BUPIVACAINE LIPOSOME 1.3 % IJ SUSP
20.0000 mL | INTRAMUSCULAR | Status: DC
  Filled 2017-12-06: qty 20

## 2017-12-06 MED ORDER — TRANEXAMIC ACID 1000 MG/10ML IV SOLN
1000.0000 mg | INTRAVENOUS | Status: AC
  Administered 2017-12-09: 1000 mg via INTRAVENOUS
  Filled 2017-12-06: qty 1100

## 2017-12-09 ENCOUNTER — Encounter (HOSPITAL_COMMUNITY)
Admission: RE | Disposition: A | Discharge status: Home / Self Care | Payer: Self-pay | Source: Ambulatory Visit | Attending: Orthopedic Surgery

## 2017-12-09 ENCOUNTER — Encounter (HOSPITAL_COMMUNITY): Payer: Self-pay

## 2017-12-09 ENCOUNTER — Observation Stay (HOSPITAL_COMMUNITY)
Admission: RE | Admit: 2017-12-09 | Discharge: 2017-12-10 | Disposition: A | Discharge status: Home / Self Care | Payer: PPO | Source: Ambulatory Visit | Attending: Orthopedic Surgery | Admitting: Orthopedic Surgery

## 2017-12-09 ENCOUNTER — Other Ambulatory Visit: Payer: Self-pay

## 2017-12-09 ENCOUNTER — Ambulatory Visit (HOSPITAL_COMMUNITY): Payer: PPO | Admitting: Emergency Medicine

## 2017-12-09 ENCOUNTER — Ambulatory Visit (HOSPITAL_COMMUNITY): Payer: PPO | Admitting: Anesthesiology

## 2017-12-09 DIAGNOSIS — Z9889 Other specified postprocedural states: Secondary | ICD-10-CM | POA: Diagnosis not present

## 2017-12-09 DIAGNOSIS — Z79899 Other long term (current) drug therapy: Secondary | ICD-10-CM | POA: Insufficient documentation

## 2017-12-09 DIAGNOSIS — Z8379 Family history of other diseases of the digestive system: Secondary | ICD-10-CM | POA: Insufficient documentation

## 2017-12-09 DIAGNOSIS — G8918 Other acute postprocedural pain: Secondary | ICD-10-CM | POA: Diagnosis not present

## 2017-12-09 DIAGNOSIS — Z803 Family history of malignant neoplasm of breast: Secondary | ICD-10-CM | POA: Diagnosis not present

## 2017-12-09 DIAGNOSIS — K219 Gastro-esophageal reflux disease without esophagitis: Secondary | ICD-10-CM | POA: Insufficient documentation

## 2017-12-09 DIAGNOSIS — Z87891 Personal history of nicotine dependence: Secondary | ICD-10-CM | POA: Insufficient documentation

## 2017-12-09 DIAGNOSIS — Z96659 Presence of unspecified artificial knee joint: Secondary | ICD-10-CM

## 2017-12-09 DIAGNOSIS — Z800 Family history of malignant neoplasm of digestive organs: Secondary | ICD-10-CM | POA: Diagnosis not present

## 2017-12-09 DIAGNOSIS — K2270 Barrett's esophagus without dysplasia: Secondary | ICD-10-CM | POA: Insufficient documentation

## 2017-12-09 DIAGNOSIS — M1712 Unilateral primary osteoarthritis, left knee: Secondary | ICD-10-CM | POA: Diagnosis not present

## 2017-12-09 DIAGNOSIS — E785 Hyperlipidemia, unspecified: Secondary | ICD-10-CM | POA: Diagnosis not present

## 2017-12-09 HISTORY — PX: TOTAL KNEE ARTHROPLASTY: SHX125

## 2017-12-09 HISTORY — DX: Unilateral primary osteoarthritis, left knee: M17.12

## 2017-12-09 SURGERY — ARTHROPLASTY, KNEE, TOTAL
Anesthesia: Spinal | Site: Knee | Laterality: Left

## 2017-12-09 MED ORDER — METHOCARBAMOL 1000 MG/10ML IJ SOLN: 500.0000 mg | Freq: Four times a day (QID) | INTRAVENOUS | Status: DC | PRN

## 2017-12-09 MED ORDER — FENTANYL CITRATE (PF) 250 MCG/5ML IJ SOLN
INTRAMUSCULAR | Status: DC | PRN
  Administered 2017-12-09 (×2): 50 ug via INTRAVENOUS

## 2017-12-09 MED ORDER — GABAPENTIN 300 MG PO CAPS
300.0000 mg | ORAL_CAPSULE | Freq: Three times a day (TID) | ORAL | Status: DC
  Administered 2017-12-09 – 2017-12-10 (×3): 300 mg via ORAL
  Filled 2017-12-09 (×3): qty 1

## 2017-12-09 MED ORDER — CHLORHEXIDINE GLUCONATE 4 % EX LIQD: 60.0000 mL | Freq: Once | CUTANEOUS | Status: DC

## 2017-12-09 MED ORDER — HYDROMORPHONE HCL 1 MG/ML IJ SOLN: 1.0000 mg | INTRAMUSCULAR | Status: DC | PRN

## 2017-12-09 MED ORDER — METOCLOPRAMIDE HCL 5 MG/ML IJ SOLN: 5.0000 mg | Freq: Three times a day (TID) | INTRAMUSCULAR | Status: DC | PRN

## 2017-12-09 MED ORDER — OXYCODONE HCL 5 MG PO TABS
5.0000 mg | ORAL_TABLET | ORAL | Status: DC | PRN
  Administered 2017-12-09 – 2017-12-10 (×2): 5 mg via ORAL
  Filled 2017-12-09 (×2): qty 1

## 2017-12-09 MED ORDER — BUPIVACAINE IN DEXTROSE 0.75-8.25 % IT SOLN
INTRATHECAL | Status: DC | PRN
  Administered 2017-12-09: 2 mL via INTRATHECAL

## 2017-12-09 MED ORDER — LIDOCAINE 2% (20 MG/ML) 5 ML SYRINGE
INTRAMUSCULAR | Status: AC
  Filled 2017-12-09: qty 5

## 2017-12-09 MED ORDER — PANTOPRAZOLE SODIUM 40 MG PO TBEC
40.0000 mg | DELAYED_RELEASE_TABLET | Freq: Every day | ORAL | Status: DC
  Administered 2017-12-10: 40 mg via ORAL
  Filled 2017-12-09: qty 1

## 2017-12-09 MED ORDER — HYDROMORPHONE HCL 1 MG/ML IJ SOLN: 0.2500 mg | INTRAMUSCULAR | Status: DC | PRN

## 2017-12-09 MED ORDER — MENTHOL 3 MG MT LOZG: 1.0000 | LOZENGE | OROMUCOSAL | Status: DC | PRN

## 2017-12-09 MED ORDER — SENNOSIDES-DOCUSATE SODIUM 8.6-50 MG PO TABS: 1.0000 | ORAL_TABLET | Freq: Every evening | ORAL | Status: DC | PRN

## 2017-12-09 MED ORDER — BUPIVACAINE LIPOSOME 1.3 % IJ SUSP
INTRAMUSCULAR | Status: DC | PRN
  Administered 2017-12-09: 20 mL

## 2017-12-09 MED ORDER — DEXAMETHASONE SODIUM PHOSPHATE 10 MG/ML IJ SOLN
INTRAMUSCULAR | Status: AC
  Filled 2017-12-09: qty 1

## 2017-12-09 MED ORDER — ROPIVACAINE HCL 7.5 MG/ML IJ SOLN
INTRAMUSCULAR | Status: DC | PRN
  Administered 2017-12-09: 20 mL via PERINEURAL

## 2017-12-09 MED ORDER — ONDANSETRON HCL 4 MG/2ML IJ SOLN: 4.0000 mg | Freq: Once | INTRAMUSCULAR | Status: DC | PRN

## 2017-12-09 MED ORDER — PHENYLEPHRINE HCL 10 MG/ML IJ SOLN
INTRAVENOUS | Status: DC | PRN
  Administered 2017-12-09: 50 ug/min via INTRAVENOUS

## 2017-12-09 MED ORDER — ACETAMINOPHEN 500 MG PO TABS
1000.0000 mg | ORAL_TABLET | Freq: Four times a day (QID) | ORAL | Status: AC
  Administered 2017-12-09 – 2017-12-10 (×3): 1000 mg via ORAL
  Filled 2017-12-09 (×5): qty 2

## 2017-12-09 MED ORDER — TRANEXAMIC ACID 1000 MG/10ML IV SOLN
1000.0000 mg | Freq: Once | INTRAVENOUS | Status: AC
  Administered 2017-12-09: 1000 mg via INTRAVENOUS

## 2017-12-09 MED ORDER — FENTANYL CITRATE (PF) 250 MCG/5ML IJ SOLN
INTRAMUSCULAR | Status: AC
  Filled 2017-12-09: qty 5

## 2017-12-09 MED ORDER — PHENYLEPHRINE 40 MCG/ML (10ML) SYRINGE FOR IV PUSH (FOR BLOOD PRESSURE SUPPORT)
PREFILLED_SYRINGE | INTRAVENOUS | Status: AC
  Filled 2017-12-09: qty 10

## 2017-12-09 MED ORDER — ONDANSETRON HCL 4 MG PO TABS: 4.0000 mg | ORAL_TABLET | Freq: Four times a day (QID) | ORAL | Status: DC | PRN

## 2017-12-09 MED ORDER — SODIUM CHLORIDE 0.9 % IR SOLN
Status: DC | PRN
  Administered 2017-12-09: 1

## 2017-12-09 MED ORDER — LACTATED RINGERS IV SOLN
INTRAVENOUS | Status: DC
  Administered 2017-12-09 (×2): via INTRAVENOUS

## 2017-12-09 MED ORDER — GABAPENTIN 300 MG PO CAPS
ORAL_CAPSULE | ORAL | Status: AC
  Administered 2017-12-09: 300 mg via ORAL
  Filled 2017-12-09: qty 1

## 2017-12-09 MED ORDER — BUPIVACAINE-EPINEPHRINE 0.5% -1:200000 IJ SOLN
INTRAMUSCULAR | Status: DC | PRN
  Administered 2017-12-09: 30 mL

## 2017-12-09 MED ORDER — FENTANYL CITRATE (PF) 100 MCG/2ML IJ SOLN
INTRAMUSCULAR | Status: AC
  Filled 2017-12-09: qty 2

## 2017-12-09 MED ORDER — METOCLOPRAMIDE HCL 5 MG PO TABS: 5.0000 mg | ORAL_TABLET | Freq: Three times a day (TID) | ORAL | Status: DC | PRN

## 2017-12-09 MED ORDER — PHENYLEPHRINE 40 MCG/ML (10ML) SYRINGE FOR IV PUSH (FOR BLOOD PRESSURE SUPPORT)
PREFILLED_SYRINGE | INTRAVENOUS | Status: DC | PRN
  Administered 2017-12-09 (×2): 80 ug via INTRAVENOUS

## 2017-12-09 MED ORDER — EPHEDRINE 5 MG/ML INJ
INTRAVENOUS | Status: AC
  Filled 2017-12-09: qty 10

## 2017-12-09 MED ORDER — THROMBIN (RECOMBINANT) 5000 UNITS EX SOLR
CUTANEOUS | Status: AC
Start: 2017-12-09 — End: 2017-12-09
  Filled 2017-12-09: qty 5000

## 2017-12-09 MED ORDER — PROPOFOL 10 MG/ML IV BOLUS
INTRAVENOUS | Status: AC
  Filled 2017-12-09: qty 20

## 2017-12-09 MED ORDER — GABAPENTIN 300 MG PO CAPS
300.0000 mg | ORAL_CAPSULE | Freq: Once | ORAL | Status: AC
  Administered 2017-12-09: 300 mg via ORAL

## 2017-12-09 MED ORDER — DEXAMETHASONE SODIUM PHOSPHATE 10 MG/ML IJ SOLN
8.0000 mg | Freq: Once | INTRAMUSCULAR | Status: AC
  Administered 2017-12-09: 10 mg via INTRAVENOUS

## 2017-12-09 MED ORDER — SODIUM CHLORIDE 0.9 % IJ SOLN
INTRAMUSCULAR | Status: DC | PRN
  Administered 2017-12-09: 20 mL

## 2017-12-09 MED ORDER — CEFAZOLIN SODIUM-DEXTROSE 1-4 GM/50ML-% IV SOLN
1.0000 g | Freq: Four times a day (QID) | INTRAVENOUS | Status: AC
  Administered 2017-12-09 (×2): 1 g via INTRAVENOUS
  Filled 2017-12-09 (×2): qty 50

## 2017-12-09 MED ORDER — BUPIVACAINE HCL (PF) 0.25 % IJ SOLN
INTRAMUSCULAR | Status: AC
  Filled 2017-12-09: qty 30

## 2017-12-09 MED ORDER — ACETAMINOPHEN 500 MG PO TABS
ORAL_TABLET | ORAL | Status: AC
  Administered 2017-12-09: 1000 mg via ORAL
  Filled 2017-12-09: qty 2

## 2017-12-09 MED ORDER — ONDANSETRON HCL 4 MG/2ML IJ SOLN: 4.0000 mg | Freq: Four times a day (QID) | INTRAMUSCULAR | Status: DC | PRN

## 2017-12-09 MED ORDER — MEPERIDINE HCL 25 MG/ML IJ SOLN: 6.2500 mg | INTRAMUSCULAR | Status: DC | PRN

## 2017-12-09 MED ORDER — ALUM & MAG HYDROXIDE-SIMETH 200-200-20 MG/5ML PO SUSP: 30.0000 mL | ORAL | Status: DC | PRN

## 2017-12-09 MED ORDER — PROPOFOL 500 MG/50ML IV EMUL
INTRAVENOUS | Status: DC | PRN
  Administered 2017-12-09: 75 ug/kg/min via INTRAVENOUS

## 2017-12-09 MED ORDER — ACETAMINOPHEN 500 MG PO TABS
1000.0000 mg | ORAL_TABLET | Freq: Once | ORAL | Status: AC
  Administered 2017-12-09: 1000 mg via ORAL

## 2017-12-09 MED ORDER — ASPIRIN EC 325 MG PO TBEC
325.0000 mg | DELAYED_RELEASE_TABLET | Freq: Two times a day (BID) | ORAL | Status: DC
  Administered 2017-12-09 – 2017-12-10 (×2): 325 mg via ORAL
  Filled 2017-12-09 (×2): qty 1

## 2017-12-09 MED ORDER — THROMBIN (RECOMBINANT) 5000 UNITS EX SOLR
CUTANEOUS | Status: AC
  Filled 2017-12-09: qty 5000

## 2017-12-09 MED ORDER — DEXAMETHASONE SODIUM PHOSPHATE 10 MG/ML IJ SOLN
10.0000 mg | Freq: Once | INTRAMUSCULAR | Status: AC
  Administered 2017-12-10: 10 mg via INTRAVENOUS
  Filled 2017-12-09: qty 1

## 2017-12-09 MED ORDER — ACETAMINOPHEN 650 MG RE SUPP: 650.0000 mg | RECTAL | Status: DC | PRN

## 2017-12-09 MED ORDER — METHOCARBAMOL 500 MG PO TABS
500.0000 mg | ORAL_TABLET | Freq: Four times a day (QID) | ORAL | Status: DC | PRN
  Administered 2017-12-09: 500 mg via ORAL
  Filled 2017-12-09: qty 1

## 2017-12-09 MED ORDER — BISACODYL 5 MG PO TBEC: 5.0000 mg | DELAYED_RELEASE_TABLET | Freq: Every day | ORAL | Status: DC | PRN

## 2017-12-09 MED ORDER — PHENOL 1.4 % MT LIQD: 1.0000 | OROMUCOSAL | Status: DC | PRN

## 2017-12-09 MED ORDER — DOCUSATE SODIUM 100 MG PO CAPS
100.0000 mg | ORAL_CAPSULE | Freq: Two times a day (BID) | ORAL | Status: DC
  Administered 2017-12-09 – 2017-12-10 (×3): 100 mg via ORAL
  Filled 2017-12-09 (×3): qty 1

## 2017-12-09 MED ORDER — BUPIVACAINE-EPINEPHRINE (PF) 0.5% -1:200000 IJ SOLN
INTRAMUSCULAR | Status: AC
  Filled 2017-12-09: qty 30

## 2017-12-09 MED ORDER — ACETAMINOPHEN 325 MG PO TABS: 650.0000 mg | ORAL_TABLET | ORAL | Status: DC | PRN

## 2017-12-09 MED ORDER — HYDROCODONE-ACETAMINOPHEN 7.5-325 MG PO TABS
1.0000 | ORAL_TABLET | Freq: Four times a day (QID) | ORAL | Status: DC
  Administered 2017-12-09 – 2017-12-10 (×3): 1 via ORAL
  Filled 2017-12-09 (×5): qty 1

## 2017-12-09 MED ORDER — MIDAZOLAM HCL 5 MG/5ML IJ SOLN
INTRAMUSCULAR | Status: DC | PRN
  Administered 2017-12-09: 2 mg via INTRAVENOUS

## 2017-12-09 MED ORDER — ZOLPIDEM TARTRATE 5 MG PO TABS: 5.0000 mg | ORAL_TABLET | Freq: Every evening | ORAL | Status: DC | PRN

## 2017-12-09 MED ORDER — MIDAZOLAM HCL 2 MG/2ML IJ SOLN
INTRAMUSCULAR | Status: AC
  Filled 2017-12-09: qty 2

## 2017-12-09 MED ORDER — TRANEXAMIC ACID 1000 MG/10ML IV SOLN
1000.0000 mg | Freq: Once | INTRAVENOUS | Status: DC
  Filled 2017-12-09: qty 10

## 2017-12-09 MED ORDER — DIPHENHYDRAMINE HCL 12.5 MG/5ML PO ELIX: 12.5000 mg | ORAL_SOLUTION | ORAL | Status: DC | PRN

## 2017-12-09 MED ORDER — FLEET ENEMA 7-19 GM/118ML RE ENEM: 1.0000 | ENEMA | Freq: Once | RECTAL | Status: DC | PRN

## 2017-12-09 SURGICAL SUPPLY — 64 items
BANDAGE ACE 6X5 VEL STRL LF (GAUZE/BANDAGES/DRESSINGS) ×3 IMPLANT
BANDAGE ESMARK 6X9 LF (GAUZE/BANDAGES/DRESSINGS) ×1 IMPLANT
BLADE SAGITTAL 13X1.27X60 (BLADE) ×2 IMPLANT
BLADE SAGITTAL 13X1.27X60MM (BLADE) ×1
BLADE SAW SGTL 83.5X18.5 (BLADE) ×3 IMPLANT
BLADE SURG 10 STRL SS (BLADE) ×3 IMPLANT
BNDG CMPR 9X6 STRL LF SNTH (GAUZE/BANDAGES/DRESSINGS) ×1
BNDG COHESIVE 1X5 TAN STRL LF (GAUZE/BANDAGES/DRESSINGS) ×2 IMPLANT
BNDG ESMARK 6X9 LF (GAUZE/BANDAGES/DRESSINGS) ×3
BOWL SMART MIX CTS (DISPOSABLE) ×3 IMPLANT
CAPT KNEE TOTAL 3 ×3 IMPLANT
CEMENT BONE SIMPLEX SPEEDSET (Cement) ×6 IMPLANT
CLOSURE WOUND 1/2 X4 (GAUZE/BANDAGES/DRESSINGS) ×1
COVER SURGICAL LIGHT HANDLE (MISCELLANEOUS) ×3 IMPLANT
CUFF TOURNIQUET SINGLE 34IN LL (TOURNIQUET CUFF) ×3 IMPLANT
DRAPE EXTREMITY T 121X128X90 (DRAPE) ×3 IMPLANT
DRAPE HALF SHEET 40X57 (DRAPES) ×3 IMPLANT
DRAPE INCISE IOBAN 66X45 STRL (DRAPES) ×6 IMPLANT
DRAPE U-SHAPE 47X51 STRL (DRAPES) ×3 IMPLANT
DRSG AQUACEL AG ADV 3.5X10 (GAUZE/BANDAGES/DRESSINGS) ×3 IMPLANT
DRSG AQUACEL AG ADV 3.5X14 (GAUZE/BANDAGES/DRESSINGS) ×2 IMPLANT
DURAPREP 26ML APPLICATOR (WOUND CARE) ×6 IMPLANT
ELECT REM PT RETURN 9FT ADLT (ELECTROSURGICAL) ×3
ELECTRODE REM PT RTRN 9FT ADLT (ELECTROSURGICAL) ×1 IMPLANT
GLOVE BIOGEL M 7.0 STRL (GLOVE) ×2 IMPLANT
GLOVE BIOGEL PI IND STRL 7.5 (GLOVE) IMPLANT
GLOVE BIOGEL PI IND STRL 8.5 (GLOVE) ×1 IMPLANT
GLOVE BIOGEL PI INDICATOR 7.5 (GLOVE) ×2
GLOVE BIOGEL PI INDICATOR 8.5 (GLOVE) ×2
GLOVE SURG ORTHO 8.0 STRL STRW (GLOVE) ×6 IMPLANT
GOWN STRL REUS W/ TWL LRG LVL3 (GOWN DISPOSABLE) ×1 IMPLANT
GOWN STRL REUS W/ TWL XL LVL3 (GOWN DISPOSABLE) ×2 IMPLANT
GOWN STRL REUS W/TWL 2XL LVL3 (GOWN DISPOSABLE) ×3 IMPLANT
GOWN STRL REUS W/TWL LRG LVL3 (GOWN DISPOSABLE) ×3
GOWN STRL REUS W/TWL XL LVL3 (GOWN DISPOSABLE) ×6
HANDPIECE INTERPULSE COAX TIP (DISPOSABLE) ×3
HOOD PEEL AWAY FACE SHEILD DIS (HOOD) ×9 IMPLANT
KIT BASIN OR (CUSTOM PROCEDURE TRAY) ×3 IMPLANT
KIT ROOM TURNOVER OR (KITS) ×3 IMPLANT
KNEE CAPITATED TOTAL 3 IMPLANT
MANIFOLD NEPTUNE II (INSTRUMENTS) ×3 IMPLANT
NDL 18GX1X1/2 (RX/OR ONLY) (NEEDLE) IMPLANT
NEEDLE 18GX1X1/2 (RX/OR ONLY) (NEEDLE) IMPLANT
NEEDLE 22X1 1/2 (OR ONLY) (NEEDLE) ×6 IMPLANT
NS IRRIG 1000ML POUR BTL (IV SOLUTION) ×3 IMPLANT
PACK TOTAL JOINT (CUSTOM PROCEDURE TRAY) ×3 IMPLANT
PAD ARMBOARD 7.5X6 YLW CONV (MISCELLANEOUS) ×6 IMPLANT
SET HNDPC FAN SPRY TIP SCT (DISPOSABLE) ×1 IMPLANT
STRIP CLOSURE SKIN 1/2X4 (GAUZE/BANDAGES/DRESSINGS) ×2 IMPLANT
SUCTION FRAZIER HANDLE 10FR (MISCELLANEOUS)
SUCTION TUBE FRAZIER 10FR DISP (MISCELLANEOUS) IMPLANT
SUT BONE WAX W31G (SUTURE) ×3 IMPLANT
SUT MNCRL AB 3-0 PS2 18 (SUTURE) ×3 IMPLANT
SUT VIC AB 0 CTB1 27 (SUTURE) ×3 IMPLANT
SUT VIC AB 1 CT1 27 (SUTURE) ×6
SUT VIC AB 1 CT1 27XBRD ANBCTR (SUTURE) ×2 IMPLANT
SUT VIC AB 2-0 CT1 27 (SUTURE) ×6
SUT VIC AB 2-0 CT1 TAPERPNT 27 (SUTURE) ×2 IMPLANT
SUT VLOC 180 0 24IN GS25 (SUTURE) ×3 IMPLANT
SYR 20CC LL (SYRINGE) ×6 IMPLANT
TOWEL OR 17X24 6PK STRL BLUE (TOWEL DISPOSABLE) ×3 IMPLANT
TOWEL OR 17X26 10 PK STRL BLUE (TOWEL DISPOSABLE) ×3 IMPLANT
TRAY CATH 16FR W/PLASTIC CATH (SET/KITS/TRAYS/PACK) ×2 IMPLANT
WRAP KNEE MAXI GEL POST OP (GAUZE/BANDAGES/DRESSINGS) ×3 IMPLANT

## 2017-12-09 NOTE — Anesthesia Postprocedure Evaluation (Signed)
Anesthesia Post Note  Patient: Nicholas LittlesRobert H Rowland  Procedure(s) Performed: TOTAL KNEE ARTHROPLASTY (Left Knee)     Patient location during evaluation: PACU Anesthesia Type: Spinal Level of consciousness: oriented and awake and alert Pain management: pain level controlled Vital Signs Assessment: post-procedure vital signs reviewed and stable Respiratory status: spontaneous breathing, respiratory function stable and patient connected to nasal cannula oxygen Cardiovascular status: blood pressure returned to baseline and stable Postop Assessment: no headache, no backache and no apparent nausea or vomiting Anesthetic complications: no    Last Vitals:  Vitals:   12/09/17 0705 12/09/17 1049  BP: 139/81   Pulse:    Resp:    Temp:  (!) 36.1 C  SpO2:      Last Pain:  Vitals:   12/09/17 1049  TempSrc:   PainSc: 0-No pain                 Lutisha Knoche DAVID

## 2017-12-09 NOTE — H&P (Signed)
Nicholas Rowland MRN:  [id number] DOB/SEX:  [birth year]/male  CHIEF COMPLAINT:  Painful left Knee  HISTORY: Patient is a 72 y.o. male presented with a history of pain in the left knee. Onset of symptoms was gradual starting a few years ago with gradually worsening course since that time. Patient has been treated conservatively with over-the-counter NSAIDs and activity modification. Patient currently rates pain in the knee at 10 out of 10 with activity. There is pain at night.  PAST MEDICAL HISTORY: Patient Active Problem List   Diagnosis Date Noted  . Atypical chest pain 08/30/2017  . Dyslipidemia 08/30/2017  . Barrett esophagus 08/29/2017  . Elevated cholesterol 08/29/2017  . Pernicious anemia 08/29/2017   Past Medical History:  Diagnosis Date  . Barrett esophagus   . GERD (gastroesophageal reflux disease)   . History of bronchitis    Past Surgical History:  Procedure Laterality Date  . BACK SURGERY    . COLONOSCOPY    . ESOPHAGOGASTRODUODENOSCOPY       MEDICATIONS:   Medications Prior to Admission  Medication Sig Dispense Refill Last Dose  . naproxen sodium (ALEVE) 220 MG tablet Take 440 mg by mouth daily as needed (for pain or headache).     . pantoprazole (PROTONIX) 40 MG tablet Take 40 mg by mouth daily.   Taking    ALLERGIES:  No Known Allergies  REVIEW OF SYSTEMS:  A comprehensive review of systems was negative except for: Musculoskeletal: positive for arthralgias and bone pain   FAMILY HISTORY:   Family History  Problem Relation Age of Onset  . Breast cancer Mother   . Barrett's esophagus Brother   . Esophageal cancer Brother     SOCIAL HISTORY:   Social History   Tobacco Use  . Smoking status: Former Games developer  . Smokeless tobacco: Never Used  . Tobacco comment: quit 15 years ago  Substance Use Topics  . Alcohol use: No     EXAMINATION:  Vital signs in last 24 hours:    There were no vitals taken for this visit.  General Appearance:    Alert,  cooperative, no distress, appears stated age  Head:    Normocephalic, without obvious abnormality, atraumatic  Eyes:    PERRL, conjunctiva/corneas clear, EOM's intact, fundi    benign, both eyes       Ears:    Normal TM's and external ear canals, both ears  Nose:   Nares normal, septum midline, mucosa normal, no drainage    or sinus tenderness  Throat:   Lips, mucosa, and tongue normal; teeth and gums normal  Neck:   Supple, symmetrical, trachea midline, no adenopathy;       thyroid:  No enlargement/tenderness/nodules; no carotid   bruit or JVD  Back:     Symmetric, no curvature, ROM normal, no CVA tenderness  Lungs:     Clear to auscultation bilaterally, respirations unlabored  Chest wall:    No tenderness or deformity  Heart:    Regular rate and rhythm, S1 and S2 normal, no murmur, rub   or gallop  Abdomen:     Soft, non-tender, bowel sounds active all four quadrants,    no masses, no organomegaly  Genitalia:    Normal male without lesion, discharge or tenderness  Rectal:    Normal tone, normal prostate, no masses or tenderness;   guaiac negative stool  Extremities:   Extremities normal, atraumatic, no cyanosis or edema  Pulses:   2+ and symmetric all extremities  Skin:  Skin color, texture, turgor normal, no rashes or lesions  Lymph nodes:   Cervical, supraclavicular, and axillary nodes normal  Neurologic:   CNII-XII intact. Normal strength, sensation and reflexes      throughout    Musculoskeletal:  ROM 0-120, Ligaments intact,  Imaging Review Plain radiographs demonstrate severe degenerative joint disease of the left knee. The overall alignment is neutral. The bone quality appears to be good for age and reported activity level.  Assessment/Plan: Primary osteoarthritis, left knee   The patient history, physical examination and imaging studies are consistent with advanced degenerative joint disease of the left knee. The patient has failed conservative treatment.  The clearance  notes were reviewed.  After discussion with the patient it was felt that Total Knee Replacement was indicated. The procedure,  risks, and benefits of total knee arthroplasty were presented and reviewed. The risks including but not limited to aseptic loosening, infection, blood clots, vascular injury, stiffness, patella tracking problems complications among others were discussed. The patient acknowledged the explanation, agreed to proceed with the plan.  Nicholas Rowland 12/09/2017, 6:23 AM

## 2017-12-09 NOTE — Anesthesia Procedure Notes (Signed)
Anesthesia Regional Block: Adductor canal block   Pre-Anesthetic Checklist: ,, timeout performed, Correct Patient, Correct Site, Correct Laterality, Correct Procedure, Correct Position, site marked, Risks and benefits discussed,  Surgical consent,  Pre-op evaluation,  At surgeon's request and post-op pain management  Laterality: Left  Prep: chloraprep       Needles:  Injection technique: Single-shot  Needle Type: Echogenic Stimulator Needle     Needle Length: 9cm  Needle Gauge: 21     Additional Needles:   Narrative:  Start time: 12/09/2017 8:10 AM End time: 12/09/2017 8:20 AM Injection made incrementally with aspirations every 5 mL.  Performed by: Personally  Anesthesiologist: Arta Brucessey, Kareem Cathey, MD  Additional Notes: Monitors applied. Patient sedated. Sterile prep and drape,hand hygiene and sterile gloves were used. Relevant anatomy identified.Needle position confirmed.Local anesthetic injected incrementally after negative aspiration. Local anesthetic spread visualized around nerve(s). Vascular puncture avoided. No complications. Image printed for medical record.The patient tolerated the procedure well.    Arta BruceKevin Teniyah Seivert MD

## 2017-12-09 NOTE — Progress Notes (Signed)
Orthopedic Tech Progress Note Patient Details:  Nicholas LittlesRobert H Rowland 1946/09/01 [id number]  CPM Left Knee CPM Left Knee: On Left Knee Flexion (Degrees): 90 Left Knee Extension (Degrees): 0 Additional Comments: Trapeze bar and foot roll  Post Interventions Patient Tolerated: Well Instructions Provided: Care of device, Adjustment of device  Saul FordyceJennifer C Alessio Bogan 12/09/2017, 11:30 AM

## 2017-12-09 NOTE — Evaluation (Signed)
Physical Therapy Evaluation Patient Details Name: Nicholas LittlesRobert H Rowland MRN: [id number] DOB: 1946-06-19 Today's Date: 12/09/2017   History of Present Illness  Pt is a 72 y.o. male now s/p L TKA on 12/09/17. PMH includes OA, GERD.  Clinical Impression  Pt presents with an overall decrease in functional mobility secondary to above. PTA, pt indep with limited mobility secondary to h/o L knee pain; lives with wife available for 24/7 support. Educ on precautions, positioning, and importance of mobility. Today, pt able to transfer, amb with RW, and ascend/descend stairs with supervision for safety. Overall, pt is moving very well. Encouraged to continue ambulating with RW and assist from family/nursing staff as needed. Pt would benefit from continued acute PT services to maximize functional mobility and independence prior to d/c with HHPT services.     Follow Up Recommendations DC plan and follow up therapy as arranged by surgeon    Equipment Recommendations  None recommended by PT    Recommendations for Other Services OT consult     Precautions / Restrictions Precautions Precautions: Knee Precaution Comments: Verbally reviewed precautions, resting knee in ext, use of bone foam Restrictions Weight Bearing Restrictions: Yes LLE Weight Bearing: Weight bearing as tolerated      Mobility  Bed Mobility Overal bed mobility: Independent                Transfers Overall transfer level: Needs assistance Equipment used: Rolling walker (2 wheeled) Transfers: Sit to/from Stand Sit to Stand: Supervision         General transfer comment: Supervision for safety; educ on hand placement  Ambulation/Gait Ambulation/Gait assistance: Supervision Ambulation Distance (Feet): 400 Feet Assistive device: Rolling walker (2 wheeled) Gait Pattern/deviations: Step-through pattern;Decreased stride length;Decreased weight shift to left Gait velocity: Decreased Gait velocity interpretation: <1.8 ft/sec,  indicative of risk for recurrent falls General Gait Details: Slow, controlled amb with RW and supervision for safety. Cues for heel-to-toe gait pattern and increased WBAT to LLE. Pt moving well   Stairs Stairs: Yes Stairs assistance: Supervision Stair Management: One rail Right;One rail Left;Step to pattern;Sideways;Forwards Number of Stairs: 10 General stair comments: Ascend/descend steps with various techniques; pt with good ability to use BUE support on L-rail. Educ on technique  Wheelchair Mobility    Modified Rankin (Stroke Patients Only)       Balance Overall balance assessment: Needs assistance   Sitting balance-Leahy Scale: Good       Standing balance-Leahy Scale: Fair Standing balance comment: Can static stand and reach out of BOS with no UE support and min guard                             Pertinent Vitals/Pain Pain Assessment: Faces Faces Pain Scale: Hurts a little bit Pain Location: L knee Pain Descriptors / Indicators: Sore Pain Intervention(s): Monitored during session    Home Living Family/patient expects to be discharged to:: Private residence Living Arrangements: Spouse/significant other Available Help at Discharge: Family;Available 24 hours/day Type of Home: House Home Access: Stairs to enter Entrance Stairs-Rails: LawyerLeft;Right Entrance Stairs-Number of Steps: 6 Home Layout: One level Home Equipment: Environmental consultantWalker - 2 wheels;Bedside commode;Shower seat      Prior Function Level of Independence: Independent               Hand Dominance        Extremity/Trunk Assessment   Upper Extremity Assessment Upper Extremity Assessment: Overall WFL for tasks assessed    Lower Extremity Assessment  Lower Extremity Assessment: LLE deficits/detail LLE Deficits / Details: s/p L TKA; hip flex 4/5, knee flex/ext grossly 3/5 LLE: Unable to fully assess due to immobilization    Cervical / Trunk Assessment Cervical / Trunk Assessment: Normal   Communication   Communication: No difficulties  Cognition Arousal/Alertness: Awake/alert Behavior During Therapy: WFL for tasks assessed/performed Overall Cognitive Status: Within Functional Limits for tasks assessed                                        General Comments General comments (skin integrity, edema, etc.): Wife present throughout session    Exercises     Assessment/Plan    PT Assessment Patient needs continued PT services  PT Problem List Decreased strength;Decreased range of motion;Decreased activity tolerance;Decreased balance;Decreased mobility;Decreased knowledge of use of DME;Pain       PT Treatment Interventions DME instruction;Gait training;Stair training;Functional mobility training;Therapeutic activities;Therapeutic exercise;Balance training;Patient/family education    PT Goals (Current goals can be found in the Care Plan section)  Acute Rehab PT Goals Patient Stated Goal: Return home PT Goal Formulation: With patient Time For Goal Achievement: 12/23/17 Potential to Achieve Goals: Good    Frequency 7X/week   Barriers to discharge        Co-evaluation               AM-PAC PT "6 Clicks" Daily Activity  Outcome Measure Difficulty turning over in bed (including adjusting bedclothes, sheets and blankets)?: None Difficulty moving from lying on back to sitting on the side of the bed? : None Difficulty sitting down on and standing up from a chair with arms (e.g., wheelchair, bedside commode, etc,.)?: A Little Help needed moving to and from a bed to chair (including a wheelchair)?: A Little Help needed walking in hospital room?: A Little Help needed climbing 3-5 steps with a railing? : A Little 6 Click Score: 20    End of Session Equipment Utilized During Treatment: Gait belt Activity Tolerance: Patient tolerated treatment well Patient left: in chair;with call bell/phone within reach   PT Visit Diagnosis: Other abnormalities of  gait and mobility (R26.89);Pain Pain - Right/Left: Left Pain - part of body: Knee    Time: 1610-9604 PT Time Calculation (min) (ACUTE ONLY): 24 min   Charges:   PT Evaluation $PT Eval Low Complexity: 1 Low PT Treatments $Gait Training: 8-22 mins   PT G Codes:       Ina Homes, PT, DPT Acute Rehab Services  Pager: [phone]  Malachy Chamber 12/09/2017, 2:05 PM

## 2017-12-09 NOTE — Anesthesia Preprocedure Evaluation (Signed)
Anesthesia Evaluation  Patient identified by MRN, date of birth, ID band Patient awake    Reviewed: Allergy & Precautions, NPO status , Patient's Chart, lab work & pertinent test results  Airway Mallampati: I  TM Distance: >3 FB Neck ROM: Full    Dental   Pulmonary former smoker,    Pulmonary exam normal        Cardiovascular Normal cardiovascular exam     Neuro/Psych    GI/Hepatic GERD  Medicated and Controlled,  Endo/Other    Renal/GU      Musculoskeletal   Abdominal   Peds  Hematology   Anesthesia Other Findings   Reproductive/Obstetrics                             Anesthesia Physical Anesthesia Plan  ASA: II  Anesthesia Plan: Spinal   Post-op Pain Management:  Regional for Post-op pain   Induction: Intravenous  PONV Risk Score and Plan: 1  Airway Management Planned: Simple Face Mask  Additional Equipment:   Intra-op Plan:   Post-operative Plan:   Informed Consent: I have reviewed the patients History and Physical, chart, labs and discussed the procedure including the risks, benefits and alternatives for the proposed anesthesia with the patient or authorized representative who has indicated his/her understanding and acceptance.     Plan Discussed with: CRNA and Surgeon  Anesthesia Plan Comments:         Anesthesia Quick Evaluation

## 2017-12-09 NOTE — Anesthesia Procedure Notes (Signed)
Procedure Name: MAC Date/Time: 12/09/2017 8:55 AM Performed by: Myna Bright, CRNA Pre-anesthesia Checklist: Patient identified, Emergency Drugs available, Suction available and Patient being monitored Oxygen Delivery Method: Circle system utilized Ventilation: Nasal airway inserted- appropriate to patient size and Mask ventilation without difficulty Placement Confirmation: positive ETCO2 and breath sounds checked- equal and bilateral Dental Injury: Teeth and Oropharynx as per pre-operative assessment  Comments: Patient presenting with sleep apnea. Unable to maintain SaO2 with simple mask and nasal airway. Placed circle system and mask on patient with straps and added CPAP. Now maintaining spontaneous ventilation with O2 sat above 95%. 7.5 nasal airway still in place.

## 2017-12-09 NOTE — Anesthesia Procedure Notes (Signed)
Spinal  Patient location during procedure: OR Start time: 12/09/2017 8:35 AM End time: 12/09/2017 8:40 AM Staffing Anesthesiologist: Arta Brucessey, Erastus Bartolomei, MD Performed: anesthesiologist  Preanesthetic Checklist Completed: patient identified, surgical consent, pre-op evaluation, timeout performed, IV checked, risks and benefits discussed and monitors and equipment checked Spinal Block Patient position: sitting Prep: DuraPrep Patient monitoring: heart rate, cardiac monitor, continuous pulse ox and blood pressure Approach: right paramedian Location: L3-4 Injection technique: single-shot Needle Needle type: Pencan  Needle gauge: 24 G Needle length: 9 cm Needle insertion depth: 9 cm

## 2017-12-09 NOTE — Transfer of Care (Signed)
Immediate Anesthesia Transfer of Care Note  Patient: SUNDEEP DESTIN  Procedure(s) Performed: TOTAL KNEE ARTHROPLASTY (Left Knee)  Patient Location: PACU  Anesthesia Type:MAC and Spinal  Level of Consciousness: awake, alert , oriented and patient cooperative  Airway & Oxygen Therapy: Patient Spontanous Breathing and Patient connected to nasal cannula oxygen  Post-op Assessment: Report given to RN and Post -op Vital signs reviewed and stable  Post vital signs: Reviewed and stable  Last Vitals:  Vitals:   12/09/17 0627 12/09/17 0705  BP:  139/81  Pulse: 70   Resp: 20   Temp: 36.7 C   SpO2: 96%     Last Pain:  Vitals:   12/09/17 0705  TempSrc:   PainSc: 0-No pain      Patients Stated Pain Goal: 2 (16/10/96 0454)  Complications: No apparent anesthesia complications

## 2017-12-10 ENCOUNTER — Encounter (HOSPITAL_COMMUNITY): Payer: Self-pay | Admitting: Orthopedic Surgery

## 2017-12-10 DIAGNOSIS — Z96652 Presence of left artificial knee joint: Secondary | ICD-10-CM | POA: Diagnosis not present

## 2017-12-10 DIAGNOSIS — M1712 Unilateral primary osteoarthritis, left knee: Secondary | ICD-10-CM | POA: Diagnosis not present

## 2017-12-10 LAB — BASIC METABOLIC PANEL
ANION GAP: 13 (ref 5–15)
BUN: 16 mg/dL (ref 6–20)
CO2: 22 mmol/L (ref 22–32)
Calcium: 9 mg/dL (ref 8.9–10.3)
Chloride: 99 mmol/L — ABNORMAL LOW (ref 101–111)
Creatinine, Ser: 0.75 mg/dL (ref 0.61–1.24)
GFR calc Af Amer: >60 mL/min (ref >60)
GLUCOSE: 163 mg/dL — AB (ref 65–99)
POTASSIUM: 4.2 mmol/L (ref 3.5–5.1)
Sodium: 134 mmol/L — ABNORMAL LOW (ref 135–145)

## 2017-12-10 LAB — CBC
HEMATOCRIT: 39 % (ref 39.0–52.0)
Hemoglobin: 12.9 g/dL — ABNORMAL LOW (ref 13.0–17.0)
MCH: 31.1 pg (ref 26.0–34.0)
MCHC: 33.1 g/dL (ref 30.0–36.0)
MCV: 94 fL (ref 78.0–100.0)
PLATELETS: 225 10*3/uL (ref 150–400)
RBC: 4.15 MIL/uL — ABNORMAL LOW (ref 4.22–5.81)
RDW: 13 % (ref 11.5–15.5)
WBC: 17.4 10*3/uL — ABNORMAL HIGH (ref 4.0–10.5)

## 2017-12-10 MED ORDER — ASPIRIN 325 MG PO TBEC: 325.0000 mg | DELAYED_RELEASE_TABLET | Freq: Two times a day (BID) | ORAL | 0 refills | Status: DC

## 2017-12-10 MED ORDER — METHOCARBAMOL 500 MG PO TABS: 500.0000 mg | ORAL_TABLET | Freq: Four times a day (QID) | ORAL | 0 refills | Status: DC | PRN

## 2017-12-10 MED ORDER — OXYCODONE HCL 10 MG PO TABS: 10.0000 mg | ORAL_TABLET | ORAL | 0 refills | Status: DC | PRN

## 2017-12-10 NOTE — Discharge Summary (Signed)
SPORTS MEDICINE & JOINT REPLACEMENT   Georgena Spurling, MD   Laurier Nancy, PA-C [address] [place], [place], [place]  [address]                             [phone]  PATIENT ID: Nicholas Rowland        MRN:  [id number]          DOB/AGE: Sep 24, 1946 / 72 y.o.    DISCHARGE SUMMARY  ADMISSION DATE:    12/09/2017 DISCHARGE DATE:   12/10/2017   ADMISSION DIAGNOSIS: primary osteoarthritis left knee    DISCHARGE DIAGNOSIS:  primary osteoarthritis left knee    ADDITIONAL DIAGNOSIS: Active Problems:   S/P total knee replacement  Past Medical History:  Diagnosis Date  . Barrett esophagus   . GERD (gastroesophageal reflux disease)   . History of bronchitis   . Osteoarthritis of left knee    Severe    PROCEDURE: Procedure(s): TOTAL KNEE ARTHROPLASTY on 12/09/2017  CONSULTS:    HISTORY:  See H&P in chart  HOSPITAL COURSE:  Nicholas Rowland is a 72 y.o. admitted on 12/09/2017 and found to have a diagnosis of primary osteoarthritis left knee.  After appropriate laboratory studies were obtained  they were taken to the operating room on 12/09/2017 and underwent Procedure(s): TOTAL KNEE ARTHROPLASTY.   They were given perioperative antibiotics:  Anti-infectives (From admission, onward)   Start     Dose/Rate Route Frequency Ordered Stop   12/09/17 1500  ceFAZolin (ANCEF) IVPB 1 g/50 mL premix     1 g 100 mL/hr over 30 Minutes Intravenous Every 6 hours 12/09/17 1223 12/10/17 0616   12/09/17 0700  ceFAZolin (ANCEF) 3 g in dextrose 5 % 50 mL IVPB     3 g 130 mL/hr over 30 Minutes Intravenous On call to O.R. 12/06/17 1327 12/09/17 0845    .  Patient given tranexamic acid IV or topical and exparel intra-operatively.  Tolerated the procedure well.    POD# 1: Vital signs were stable.  Patient denied Chest pain, shortness of breath, or calf pain.  Patient was started on Lovenox 30 mg subcutaneously twice daily at 8am.  Consults to PT, OT, and care management were made.  The patient was  weight bearing as tolerated.  CPM was placed on the operative leg 0-90 degrees for 6-8 hours a day. When out of the CPM, patient was placed in the foam block to achieve full extension. Incentive spirometry was taught.  Dressing was changed.       POD #2, Continued  PT for ambulation and exercise program.  IV saline locked.  O2 discontinued.    The remainder of the hospital course was dedicated to ambulation and strengthening.   The patient was discharged on 1 Day Post-Op in  Good condition.  Blood products given:none  DIAGNOSTIC STUDIES: Recent vital signs:  Patient Vitals for the past 24 hrs:  BP Temp Temp src Pulse Resp SpO2  12/10/17 0525 114/80 98 F (36.7 C) Oral 77 17 95 %  12/09/17 2136 116/76 99.1 F (37.3 C) Oral (!) 107 18 96 %       Recent laboratory studies: Recent Labs    12/10/17 0633  WBC 17.4*  HGB 12.9*  HCT 39.0  PLT 225   Recent Labs    12/10/17 0633  NA 134*  K 4.2  CL 99*  CO2 22  BUN 16  CREATININE 0.75  GLUCOSE 163*  CALCIUM 9.0   No results found for: INR, PROTIME   Recent Radiographic Studies :  No results found.  DISCHARGE INSTRUCTIONS: Discharge Instructions    CPM   Complete by:  As directed    Continuous passive motion machine (CPM):      Use the CPM from 0 to 90 for 4-6 hours per day.      You may increase by 10 per day.  You may break it up into 2 or 3 sessions per day.      Use CPM for 2 weeks or until you are told to stop.   Call MD / Call 911   Complete by:  As directed    If you experience chest pain or shortness of breath, CALL 911 and be transported to the hospital emergency room.  If you develope a fever above 101 F, pus (white drainage) or increased drainage or redness at the wound, or calf pain, call your surgeon's office.   Constipation Prevention   Complete by:  As directed    Drink plenty of fluids.  Prune juice may be helpful.  You may use a stool softener, such as Colace (over the counter) 100 mg twice a day.  Use  MiraLax (over the counter) for constipation as needed.   Diet - low sodium heart healthy   Complete by:  As directed    Discharge instructions   Complete by:  As directed    INSTRUCTIONS AFTER JOINT REPLACEMENT   Remove items at home which could result in a fall. This includes throw rugs or furniture in walking pathways ICE to the affected joint every three hours while awake for 30 minutes at a time, for at least the first 3-5 days, and then as needed for pain and swelling.  Continue to use ice for pain and swelling. You may notice swelling that will progress down to the foot and ankle.  This is normal after surgery.  Elevate your leg when you are not up walking on it.   Continue to use the breathing machine you got in the hospital (incentive spirometer) which will help keep your temperature down.  It is common for your temperature to cycle up and down following surgery, especially at night when you are not up moving around and exerting yourself.  The breathing machine keeps your lungs expanded and your temperature down.   DIET:  As you were doing prior to hospitalization, we recommend a well-balanced diet.  DRESSING / WOUND CARE / SHOWERING  Keep the surgical dressing until follow up.  The dressing is water proof, so you can shower without any extra covering.  IF THE DRESSING FALLS OFF or the wound gets wet inside, change the dressing with sterile gauze.  Please use good hand washing techniques before changing the dressing.  Do not use any lotions or creams on the incision until instructed by your surgeon.    ACTIVITY  Increase activity slowly as tolerated, but follow the weight bearing instructions below.   No driving for 6 weeks or until further direction given by your physician.  You cannot drive while taking narcotics.  No lifting or carrying greater than 10 lbs. until further directed by your surgeon. Avoid periods of inactivity such as sitting longer than an hour when not asleep. This  helps prevent blood clots.  You may return to work once you are authorized by your doctor.     WEIGHT BEARING   Weight bearing as tolerated with assist device (walker, cane,  etc) as directed, use it as long as suggested by your surgeon or therapist, typically at least 4-6 weeks.   EXERCISES  Results after joint replacement surgery are often greatly improved when you follow the exercise, range of motion and muscle strengthening exercises prescribed by your doctor. Safety measures are also important to protect the joint from further injury. Any time any of these exercises cause you to have increased pain or swelling, decrease what you are doing until you are comfortable again and then slowly increase them. If you have problems or questions, call your caregiver or physical therapist for advice.   Rehabilitation is important following a joint replacement. After just a few days of immobilization, the muscles of the leg can become weakened and shrink (atrophy).  These exercises are designed to build up the tone and strength of the thigh and leg muscles and to improve motion. Often times heat used for twenty to thirty minutes before working out will loosen up your tissues and help with improving the range of motion but do not use heat for the first two weeks following surgery (sometimes heat can increase post-operative swelling).   These exercises can be done on a training (exercise) mat, on the floor, on a table or on a bed. Use whatever works the best and is most comfortable for you.    Use music or television while you are exercising so that the exercises are a pleasant break in your day. This will make your life better with the exercises acting as a break in your routine that you can look forward to.   Perform all exercises about fifteen times, three times per day or as directed.  You should exercise both the operative leg and the other leg as well.   Exercises include:   Quad Sets - Tighten up the  muscle on the front of the thigh (Quad) and hold for 5-10 seconds.   Straight Leg Raises - With your knee straight (if you were given a brace, keep it on), lift the leg to 60 degrees, hold for 3 seconds, and slowly lower the leg.  Perform this exercise against resistance later as your leg gets stronger.  Leg Slides: Lying on your back, slowly slide your foot toward your buttocks, bending your knee up off the floor (only go as far as is comfortable). Then slowly slide your foot back down until your leg is flat on the floor again.  Angel Wings: Lying on your back spread your legs to the side as far apart as you can without causing discomfort.  Hamstring Strength:  Lying on your back, push your heel against the floor with your leg straight by tightening up the muscles of your buttocks.  Repeat, but this time bend your knee to a comfortable angle, and push your heel against the floor.  You may put a pillow under the heel to make it more comfortable if necessary.   A rehabilitation program following joint replacement surgery can speed recovery and prevent re-injury in the future due to weakened muscles. Contact your doctor or a physical therapist for more information on knee rehabilitation.    CONSTIPATION  Constipation is defined medically as fewer than three stools per week and severe constipation as less than one stool per week.  Even if you have a regular bowel pattern at home, your normal regimen is likely to be disrupted due to multiple reasons following surgery.  Combination of anesthesia, postoperative narcotics, change in appetite and fluid intake all can  affect your bowels.   YOU MUST use at least one of the following options; they are listed in order of increasing strength to get the job done.  They are all available over the counter, and you may need to use some, POSSIBLY even all of these options:    Drink plenty of fluids (prune juice may be helpful) and high fiber foods Colace 100 mg by  mouth twice a day  Senokot for constipation as directed and as needed Dulcolax (bisacodyl), take with full glass of water  Miralax (polyethylene glycol) once or twice a day as needed.  If you have tried all these things and are unable to have a bowel movement in the first 3-4 days after surgery call either your surgeon or your primary doctor.    If you experience loose stools or diarrhea, hold the medications until you stool forms back up.  If your symptoms do not get better within 1 week or if they get worse, check with your doctor.  If you experience "the worst abdominal pain ever" or develop nausea or vomiting, please contact the office immediately for further recommendations for treatment.   ITCHING:  If you experience itching with your medications, try taking only a single pain pill, or even half a pain pill at a time.  You can also use Benadryl over the counter for itching or also to help with sleep.   TED HOSE STOCKINGS:  Use stockings on both legs until for at least 2 weeks or as directed by physician office. They may be removed at night for sleeping.  MEDICATIONS:  See your medication summary on the "After Visit Summary" that nursing will review with you.  You may have some home medications which will be placed on hold until you complete the course of blood thinner medication.  It is important for you to complete the blood thinner medication as prescribed.  PRECAUTIONS:  If you experience chest pain or shortness of breath - call 911 immediately for transfer to the hospital emergency department.   If you develop a fever greater that 101 F, purulent drainage from wound, increased redness or drainage from wound, foul odor from the wound/dressing, or calf pain - CONTACT YOUR SURGEON.                                                   FOLLOW-UP APPOINTMENTS:  If you do not already have a post-op appointment, please call the office for an appointment to be seen by your surgeon.  Guidelines for  how soon to be seen are listed in your "After Visit Summary", but are typically between 1-4 weeks after surgery.  OTHER INSTRUCTIONS:   Knee Replacement:  Do not place pillow under knee, focus on keeping the knee straight while resting. CPM instructions: 0-90 degrees, 2 hours in the morning, 2 hours in the afternoon, and 2 hours in the evening. Place foam block, curve side up under heel at all times except when in CPM or when walking.  DO NOT modify, tear, cut, or change the foam block in any way.  MAKE SURE YOU:  Understand these instructions.  Get help right away if you are not doing well or get worse.    Thank you for letting us be a part of your medical care team.  It is a privilege we respect greatly.  We hope these instructions will help you stay on track for a fast and full recovery!   Increase activity slowly as tolerated   Complete by:  As directed       DISCHARGE MEDICATIONS:   Allergies as of 12/10/2017   No Known Allergies     Medication List    STOP taking these medications   naproxen sodium 220 MG tablet Commonly known as:  ALEVE     TAKE these medications   aspirin 325 MG EC tablet Take 1 tablet (325 mg total) by mouth 2 (two) times daily.   methocarbamol 500 MG tablet Commonly known as:  ROBAXIN Take 1-2 tablets (500-1,000 mg total) by mouth every 6 (six) hours as needed for muscle spasms.   Oxycodone HCl 10 MG Tabs Take 1 tablet (10 mg total) by mouth every 3 (three) hours as needed for moderate pain ((score 4 to 6)).   pantoprazole 40 MG tablet Commonly known as:  PROTONIX Take 40 mg by mouth daily.            Durable Medical Equipment  (From admission, onward)        Start     Ordered   12/09/17 1223  DME Walker rolling  Once    Question:  Patient needs a walker to treat with the following condition  Answer:  S/P total knee replacement   12/09/17 1223   12/09/17 1223  DME 3 n 1  Once     12/09/17 1223   12/09/17 1223  DME Bedside commode   Once    Question:  Patient needs a bedside commode to treat with the following condition  Answer:  S/P total knee replacement   12/09/17 1223      FOLLOW UP VISIT:    DISPOSITION: HOME VS. SNF  CONDITION:  Good   Guy Sandifer 12/10/2017, 12:48 PM

## 2017-12-10 NOTE — Op Note (Signed)
TOTAL KNEE REPLACEMENT OPERATIVE NOTE:  12/09/2017  3:08 PM  PATIENT:  Nicholas Rowland  72 y.o. male  PRE-OPERATIVE DIAGNOSIS:  primary osteoarthritis left knee  POST-OPERATIVE DIAGNOSIS:  primary osteoarthritis left knee  PROCEDURE:  Procedure(s): TOTAL KNEE ARTHROPLASTY  SURGEON:  Surgeon(s): Dannielle Huh, MD  PHYSICIAN ASSISTANT:  Skip Mayer, [facility]  ANESTHESIA:   spinal  DRAINS: Hemovac  SPECIMEN: None  COUNTS:  Correct  TOURNIQUET:   Total Tourniquet Time Documented: Thigh (Left) - 56 minutes Total: Thigh (Left) - 56 minutes   DICTATION:  Indication for procedure:    The patient is a 72 y.o. male who has failed conservative treatment for primary osteoarthritis left knee.  Informed consent was obtained prior to anesthesia. The risks versus benefits of the operation were explain and in a way the patient can, and did, understand.   On the implant demand matching protocol, this patient scored 10.  Therefore, this patient was not receive a polyethylene insert with vitamin E which is a high demand implant.  Description of procedure:     The patient was taken to the operating room and placed under anesthesia.  The patient was positioned in the usual fashion taking care that all body parts were adequately padded and/or protected.  I foley catheter was not placed.  A tourniquet was applied and the leg prepped and draped in the usual sterile fashion.  The extremity was exsanguinated with the esmarch and tourniquet inflated to 350 mmHg.  Pre-operative range of motion was normal.  The knee was in 6 degree of mild varus.  A midline incision approximately 6-7 inches long was made with a #10 blade.  A new blade was used to make a parapatellar arthrotomy going 2-3 cm into the quadriceps tendon, over the patella, and alongside the medial aspect of the patellar tendon.  A synovectomy was then performed with the #10 blade and forceps. I then elevated the deep MCL off the medial tibial  metaphysis subperiosteally around to the semimembranosus attachment.    I everted the patella and used calipers to measure patellar thickness.  I used the reamer to ream down to appropriate thickness to recreate the native thickness.  I then removed excess bone with the rongeur and sagittal saw.  I used the appropriately sized template and drilled the three lug holes.  I then put the trial in place and measured the thickness with the calipers to ensure recreation of the native thickness.  The trial was then removed and the patella subluxed and the knee brought into flexion.  A homan retractor was place to retract and protect the patella and lateral structures.  A Z-retractor was place medially to protect the medial structures.  The extra-medullary alignment system was used to make cut the tibial articular surface perpendicular to the anamotic axis of the tibia and in 3 degrees of posterior slope.  The cut surface and alignment jig was removed.  I then used the intramedullary alignment guide to make a 6 valgus cut on the distal femur.  I then marked out the epicondylar axis on the distal femur.  The posterior condylar axis measured 3 degrees.  I then used the anterior referencing sizer and measured the femur to be a size 12.  The 4-In-1 cutting block was screwed into place in external rotation matching the posterior condylar angle, making our cuts perpendicular to the epicondylar axis.  Anterior, posterior and chamfer cuts were made with the sagittal saw.  The cutting block and cut pieces  were removed.  A lamina spreader was placed in 90 degrees of flexion.  The ACL, PCL, menisci, and posterior condylar osteophytes were removed.  A 12 mm spacer blocked was found to offer good flexion and extension gap balance after mild in degree releasing.   The scoop retractor was then placed and the femoral finishing block was pinned in place.  The small sagittal saw was used as well as the lug drill to finish the femur.   The block and cut surfaces were removed and the medullary canal hole filled with autograft bone from the cut pieces.  The tibia was delivered forward in deep flexion and external rotation.  A size G tray was selected and pinned into place centered on the medial 1/3 of the tibial tubercle.  The reamer and keel was used to prepare the tibia through the tray.    I then trialed with the size 12 femur, size G tibia, a 12 mm insert and the 38 patella.  I had excellent flexion/extension gap balance, excellent patella tracking.  Flexion was full and beyond 120 degrees; extension was zero.  These components were chosen and the staff opened them to me on the back table while the knee was lavaged copiously and the cement mixed.  The soft tissue was infiltrated with 60cc of exparel 1.3% through a 21 gauge needle.  I cemented in the components and removed all excess cement.  The polyethylene tibial component was snapped into place and the knee placed in extension while cement was hardening.  The capsule was infilltrated with 30cc of .25% Marcaine with epinephrine.  A hemovac was place in the joint exiting superolaterally.  A pain pump was place superomedially superficial to the arthrotomy.  Once the cement was hard, the tourniquet was let down.  Hemostasis was obtained.  The arthrotomy was closed with figure-8 #1 vicryl sutures.  The deep soft tissues were closed with #0 vicryls and the subcuticular layer closed with a running #2-0 vicryl.  The skin was reapproximated and closed with skin staples.  The wound was dressed with xeroform, 4 x4's, 2 ABD sponges, a single layer of webril and a TED stocking.   The patient was then awakened, extubated, and taken to the recovery room in stable condition.  BLOOD LOSS:  300cc DRAINS: 1 hemovac, 1 pain catheter COMPLICATIONS:  None.  PLAN OF CARE: Admit for overnight observation  PATIENT DISPOSITION:  PACU - hemodynamically stable.   Delay start of Pharmacological VTE  agent (>24hrs) due to surgical blood loss or risk of bleeding:  not applicable  Please fax a copy of this op note to my office at [phone] (please only include page 1 and 2 of the Case Information op note)

## 2017-12-10 NOTE — Care Management Note (Signed)
Case Management Note  Patient Details  Name: Nicholas LittlesRobert H Rowland MRN: [id number] Date of Birth: 27-Oct-1946  Subjective/Objective:    72 yr old gentleman s/p left total knee arthroplasty.                Action/Plan: Patient was preoperatively setup with [facility], no changes. DME has been delivered to his home, he will have family support at discharge.    Expected Discharge Date:  12/10/17               Expected Discharge Plan:  Home w Home Health Services  In-House Referral:  NA  Discharge planning Services  CM Consult  Post Acute Care Choice:  Home Health, Durable Medical Equipment Choice offered to:  Patient  DME Arranged:  3-N-1, Walker rolling, CPM DME Agency:  [employer]  HH Arranged:  PT HH Agency:  [facility] (formerly [facility])  Status of Service:  Completed, signed off  If discussed at [employer] Meetings, dates discussed:    Additional Comments:  Durenda GuthrieBrady, Donyell Ding Naomi, RN 12/10/2017, 3:28 PM

## 2017-12-10 NOTE — Progress Notes (Signed)
Physical Therapy Treatment Patient Details Name: Nicholas LittlesRobert H Rowland Rowland: [id number] DOB: [birth year] Today's Date: 12/10/2017    History of Present Illness Pt is a 72 y.o. male now s/p L TKA on 12/09/17. PMH includes OA, GERD.    PT Comments    Pt progressing towards physical therapy goals. Was able to perform transfers and ambulation with modified independence to supervision for safety. No unsteadiness or LOB noted throughout session. Pt and wife were educated on HEP, car transfer, positioning, and general safety with mobility. Pt anticipates d/c home this afternoon, and feel he is safe to do so without a second PT session. Will continue to follow.    Follow Up Recommendations  DC plan and follow up therapy as arranged by surgeon     Equipment Recommendations  None recommended by PT    Recommendations for Other Services OT consult     Precautions / Restrictions Precautions Precautions: Knee Precaution Comments: Verbally reviewed precautions, resting knee in ext, use of bone foam Restrictions Weight Bearing Restrictions: Yes LLE Weight Bearing: Weight bearing as tolerated    Mobility  Bed Mobility Overal bed mobility: Independent                Transfers Overall transfer level: Needs assistance Equipment used: Rolling walker (2 wheeled) Transfers: Sit to/from Stand Sit to Stand: Supervision;Modified independent (Device/Increase time)         General transfer comment: Light supervision initially progressing to modified independence by end of session. Pt demonstrated proper hand placement on seated surface for safety.   Ambulation/Gait Ambulation/Gait assistance: Supervision Ambulation Distance (Feet): 350 Feet Assistive device: Rolling walker (2 wheeled) Gait Pattern/deviations: Step-through pattern;Decreased stride length;Decreased weight shift to left Gait velocity: Decreased Gait velocity interpretation: Below normal speed for age/gender General Gait Details:  VC's for improved posture, forward gaze, and increased heel strike on the L. Pt was able to improve step-through gait pattern and smoothness of gait cycle with cues.    Stairs         General stair comments: Deferred as pt reports feeling comfortable negotiating stiars from prior PT session.   Wheelchair Mobility    Modified Rankin (Stroke Patients Only)       Balance Overall balance assessment: Needs assistance   Sitting balance-Leahy Scale: Good       Standing balance-Leahy Scale: Fair Standing balance comment: Can static stand and reach out of BOS with no UE support and min guard                            Cognition Arousal/Alertness: Awake/alert Behavior During Therapy: WFL for tasks assessed/performed Overall Cognitive Status: Within Functional Limits for tasks assessed                                        Exercises Total Joint Exercises Quad Sets: 5 reps Short Arc Quad: 10 reps Heel Slides: 10 reps Hip ABduction/ADduction: 10 reps Straight Leg Raises: 10 reps Long Arc Quad: 10 reps Goniometric ROM: 6-98 AAROM in sitting    General Comments        Pertinent Vitals/Pain Pain Assessment: Faces Faces Pain Scale: Hurts a little bit Pain Location: L knee Pain Descriptors / Indicators: Sore Pain Intervention(s): Limited activity within patient's tolerance;Monitored during session;Repositioned    Home Living  Prior Function            PT Goals (current goals can now be found in the care plan section) Acute Rehab PT Goals Patient Stated Goal: Return home PT Goal Formulation: With patient Time For Goal Achievement: 12/23/17 Potential to Achieve Goals: Good Progress towards PT goals: Progressing toward goals    Frequency    7X/week      PT Plan Current plan remains appropriate    Co-evaluation              AM-PAC PT "6 Clicks" Daily Activity  Outcome Measure  Difficulty  turning over in bed (including adjusting bedclothes, sheets and blankets)?: None Difficulty moving from lying on back to sitting on the side of the bed? : None Difficulty sitting down on and standing up from a chair with arms (e.g., wheelchair, bedside commode, etc,.)?: A Little Help needed moving to and from a bed to chair (including a wheelchair)?: A Little Help needed walking in hospital room?: A Little Help needed climbing 3-5 steps with a railing? : A Little 6 Click Score: 20    End of Session Equipment Utilized During Treatment: Gait belt Activity Tolerance: Patient tolerated treatment well Patient left: in chair;with call bell/phone within reach Nurse Communication: Mobility status PT Visit Diagnosis: Other abnormalities of gait and mobility (R26.89);Pain Pain - Right/Left: Left Pain - part of body: Knee     Time: 1610-9604 PT Time Calculation (min) (ACUTE ONLY): 32 min  Charges:  $Gait Training: 8-22 mins $Therapeutic Exercise: 8-22 mins                    G Codes:       Conni Slipper, PT, DPT Acute Rehabilitation Services Pager: [phone]    Marylynn Pearson 12/10/2017, 1:08 PM

## 2017-12-10 NOTE — Evaluation (Signed)
Occupational Therapy Evaluation Patient Details Name: Nicholas Rowland MRN: [id number] DOB: Jul 08, 1946 Today's Date: 12/10/2017    History of Present Illness Pt is a 72 y.o. male now s/p L TKA on 12/09/17. PMH includes OA, GERD.   Clinical Impression   Patient evaluated by Occupational Therapy with no further acute OT needs identified. All education has been completed and the patient has no further questions. See below for any follow-up Occupational Therapy or equipment needs. OT to sign off. Thank you for referral.      Follow Up Recommendations  No OT follow up    Equipment Recommendations  None recommended by OT    Recommendations for Other Services       Precautions / Restrictions Precautions Precautions: Knee Precaution Comments: reviewed knee precautions Restrictions Weight Bearing Restrictions: Yes LLE Weight Bearing: Weight bearing as tolerated      Mobility Bed Mobility Overal bed mobility: Independent             General bed mobility comments: in chair on arrival  Transfers Overall transfer level: Needs assistance Equipment used: Rolling walker (2 wheeled) Transfers: Sit to/from Stand Sit to Stand: Supervision;Modified independent (Device/Increase time)         General transfer comment: cues to decr speed to keep feet inside RW    Balance Overall balance assessment: Needs assistance   Sitting balance-Leahy Scale: Good       Standing balance-Leahy Scale: Fair Standing balance comment: Can static stand and reach out of BOS with no UE support and min guard                           ADL either performed or assessed with clinical judgement   ADL Overall ADL's : Needs assistance/impaired Eating/Feeding: Independent   Grooming: Independent   Upper Body Bathing: Independent   Lower Body Bathing: Minimal assistance   Upper Body Dressing : Supervision/safety   Lower Body Dressing: Minimal assistance Lower Body Dressing Details  (indicate cue type and reason): wife (A) this session and reports that she will help at home. Pt educated on dressing operative leg first Toilet Transfer: Supervision/safety       Tub/ Shower Transfer: Minimal Engineer, water Details (indicate cue type and reason): pt taking DME out of tub that was recommended and attempting to step over. Pt with knee extension and MOD (A) for LOB. pt instructed again to follow therapist cues to prevent fall. pt more attentive and less distracted after LOB. pt was able to safely back up to tub and step over with good leg followed by operative leg. pt encouraged to sit on stool he has at home but pt verbalized "i can do it like this" Functional mobility during ADLs: Supervision/safety;Rolling walker General ADL Comments: pt educated on decr speed throughout session and a lot of teach back with verbalization back to therapist to ensure understanding of education. wife present entire session and asking questions    Educated patient on knee full extension with return demonstration, educated tub transfer,never to wash directly on incision site, always use fresh clean linen (one time use then place in laundry), avoid water under bandage and benefits of wrapping dressing, sleeping positioning, avoid putting pillow under knee. Educated on blue block and position for sleeping   Vision   Vision Assessment?: No apparent visual deficits     Perception     Praxis      Pertinent Vitals/Pain Pain Assessment: Faces Faces Pain  Scale: Hurts a little bit Pain Location: L knee Pain Descriptors / Indicators: Sore Pain Intervention(s): Monitored during session;Premedicated before session;Repositioned     Hand Dominance Right   Extremity/Trunk Assessment Upper Extremity Assessment Upper Extremity Assessment: Overall WFL for tasks assessed   Lower Extremity Assessment Lower Extremity Assessment: Defer to PT evaluation   Cervical / Trunk  Assessment Cervical / Trunk Assessment: Normal   Communication Communication Communication: No difficulties   Cognition Arousal/Alertness: Awake/alert Behavior During Therapy: Impulsive Overall Cognitive Status: Within Functional Limits for tasks assessed                                 General Comments: pt cued to decr speed and control movements. pt states "i am a little hyper"   General Comments       Exercises Exercises: Total Joint Total Joint Exercises Quad Sets: 5 reps Short Arc Quad: 10 reps Heel Slides: 10 reps Hip ABduction/ADduction: 10 reps Straight Leg Raises: 10 reps Long Arc Quad: 10 reps Goniometric ROM: 6-98 AAROM in sitting   Shoulder Instructions      Home Living Family/patient expects to be discharged to:: Private residence Living Arrangements: Spouse/significant other Available Help at Discharge: Family;Available 24 hours/day Type of Home: House Home Access: Stairs to enter Entergy CorporationEntrance Stairs-Number of Steps: 6 Entrance Stairs-Rails: Left;Right Home Layout: One level     Bathroom Shower/Tub: Chief Strategy OfficerTub/shower unit   Bathroom Toilet: Standard     Home Equipment: Environmental consultantWalker - 2 wheels;Bedside commode;Shower seat   Additional Comments: will have wife (A) at all times.      Prior Functioning/Environment Level of Independence: Independent                 OT Problem List:        OT Treatment/Interventions:      OT Goals(Current goals can be found in the care plan section) Acute Rehab OT Goals Patient Stated Goal: Return home  OT Frequency:     Barriers to D/C:            Co-evaluation              AM-PAC PT "6 Clicks" Daily Activity     Outcome Measure Help from another person eating meals?: None Help from another person taking care of personal grooming?: None Help from another person toileting, which includes using toliet, bedpan, or urinal?: None Help from another person bathing (including washing, rinsing,  drying)?: A Little Help from another person to put on and taking off regular upper body clothing?: None Help from another person to put on and taking off regular lower body clothing?: A Little 6 Click Score: 22   End of Session Equipment Utilized During Treatment: Gait belt;Rolling walker CPM Left Knee CPM Left Knee: Off Left Knee Flexion (Degrees): (90)  Activity Tolerance: Patient tolerated treatment well Patient left: in chair;with call bell/phone within reach;Other (comment)(was in blue block on arrival and now removed)                   Time: 1135-1159 OT Time Calculation (min): 24 min Charges:  OT General Charges $OT Visit: 1 Visit OT Evaluation $OT Eval Moderate Complexity: 1 Mod G-Codes:      Mateo FlowJones, Brynn   OTR/L Pager: [phone] Office: [phone] .   Boone MasterJones, Rosamaria Donn B 12/10/2017, 2:48 PM

## 2017-12-10 NOTE — Progress Notes (Signed)
SPORTS MEDICINE AND JOINT REPLACEMENT  Georgena SpurlingStephen Lucey, MD    Laurier Nancyolby Robbins, PA-C [address], [place], [place]  [id number]                             [phone]   PROGRESS NOTE  Subjective:  negative for Chest Pain  negative for Shortness of Breath  negative for Nausea/Vomiting   negative for Calf Pain  negative for Bowel Movement   Tolerating Diet: yes         Patient reports pain as 4 on 0-10 scale.    Objective: Vital signs in last 24 hours:    Patient Vitals for the past 24 hrs:  BP Temp Temp src Pulse Resp SpO2 Height Weight  12/10/17 0525 114/80 98 F (36.7 C) Oral 77 17 95 % - -  12/09/17 2136 116/76 99.1 F (37.3 C) Oral (!) 107 18 96 % - -  12/09/17 1206 - 97.7 F (36.5 C) - - - - - -  12/09/17 1204 113/64 - - (!) 54 11 97 % - -  12/09/17 1200 - - - (!) 55 14 93 % - -  12/09/17 1148 118/75 - - (!) 53 10 96 % - -  12/09/17 1145 - - - (!) 55 11 96 % - -  12/09/17 1133 101/69 - - (!) 57 17 97 % - -  12/09/17 1130 - - - (!) 56 13 98 % - -  12/09/17 1119 110/78 - - (!) 59 14 97 % - -  12/09/17 1115 - - - (!) 53 (!) 21 98 % - -  12/09/17 1104 127/78 - - 79 20 98 % - -  12/09/17 1100 - - - 74 16 96 % - -  12/09/17 1050 129/77 - - 83 14 97 % - -  12/09/17 1049 - (!) 97 F (36.1 C) - - - - - -  12/09/17 0705 139/81 - - - - - 6\' 4"  (1.93 m) (!) 143.3 kg (316 lb)    @flow {1959:LAST@   Intake/Output from previous day:   01/21 0701 - 01/22 0700 In: 1440 [P.O.:240; I.V.:1200] Out: 800 [Urine:750]   Intake/Output this shift:   No intake/output data recorded.   Intake/Output      01/21 0701 - 01/22 0700 01/22 0701 - 01/23 0700   P.O. 240    I.V. (mL/kg) 1200 (8.4)    Total Intake(mL/kg) 1440 (10)    Urine (mL/kg/hr) 750 (0.2)    Blood 50    Total Output 800    Net +640            LABORATORY DATA: No results for input(s): WBC, HGB, HCT, PLT in the last 168 hours. No results for input(s): NA, K, CL, CO2, BUN, CREATININE, GLUCOSE, CALCIUM in the  last 168 hours. No results found for: INR, PROTIME  Examination:  General appearance: alert, cooperative and no distress Extremities: extremities normal, atraumatic, no cyanosis or edema  Wound Exam: clean, dry, intact   Drainage:  None: wound tissue dry  Motor Exam: Quadriceps and Hamstrings Intact  Sensory Exam: Superficial Peroneal, Deep Peroneal and Tibial normal   Assessment:    1 Day Post-Op  Procedure(s) (LRB): TOTAL KNEE ARTHROPLASTY (Left)  ADDITIONAL DIAGNOSIS:  Active Problems:   S/P total knee replacement     Plan: Physical Therapy as ordered Weight Bearing as Tolerated (WBAT)  DVT Prophylaxis:  Aspirin  DISCHARGE PLAN: Home  DISCHARGE NEEDS: HHPT   Patient progressing well, expected D/C home today         Guy Sandifer 12/10/2017, 7:02 AM

## 2017-12-11 DIAGNOSIS — Z471 Aftercare following joint replacement surgery: Secondary | ICD-10-CM | POA: Diagnosis not present

## 2017-12-11 DIAGNOSIS — Z9181 History of falling: Secondary | ICD-10-CM | POA: Diagnosis not present

## 2017-12-11 DIAGNOSIS — K2270 Barrett's esophagus without dysplasia: Secondary | ICD-10-CM | POA: Diagnosis not present

## 2017-12-11 DIAGNOSIS — Z96652 Presence of left artificial knee joint: Secondary | ICD-10-CM | POA: Diagnosis not present

## 2017-12-11 DIAGNOSIS — Z79891 Long term (current) use of opiate analgesic: Secondary | ICD-10-CM | POA: Diagnosis not present

## 2017-12-11 DIAGNOSIS — Z7982 Long term (current) use of aspirin: Secondary | ICD-10-CM | POA: Diagnosis not present

## 2017-12-11 DIAGNOSIS — E7800 Pure hypercholesterolemia, unspecified: Secondary | ICD-10-CM | POA: Diagnosis not present

## 2017-12-11 DIAGNOSIS — D510 Vitamin B12 deficiency anemia due to intrinsic factor deficiency: Secondary | ICD-10-CM | POA: Diagnosis not present

## 2017-12-16 DIAGNOSIS — Z7982 Long term (current) use of aspirin: Secondary | ICD-10-CM | POA: Diagnosis not present

## 2017-12-16 DIAGNOSIS — Z9181 History of falling: Secondary | ICD-10-CM | POA: Diagnosis not present

## 2017-12-16 DIAGNOSIS — Z79891 Long term (current) use of opiate analgesic: Secondary | ICD-10-CM | POA: Diagnosis not present

## 2017-12-16 DIAGNOSIS — K2270 Barrett's esophagus without dysplasia: Secondary | ICD-10-CM | POA: Diagnosis not present

## 2017-12-16 DIAGNOSIS — Z96652 Presence of left artificial knee joint: Secondary | ICD-10-CM | POA: Diagnosis not present

## 2017-12-16 DIAGNOSIS — E7800 Pure hypercholesterolemia, unspecified: Secondary | ICD-10-CM | POA: Diagnosis not present

## 2017-12-16 DIAGNOSIS — Z471 Aftercare following joint replacement surgery: Secondary | ICD-10-CM | POA: Diagnosis not present

## 2017-12-16 DIAGNOSIS — D510 Vitamin B12 deficiency anemia due to intrinsic factor deficiency: Secondary | ICD-10-CM | POA: Diagnosis not present

## 2017-12-19 DIAGNOSIS — Z96652 Presence of left artificial knee joint: Secondary | ICD-10-CM | POA: Diagnosis not present

## 2017-12-20 DIAGNOSIS — R262 Difficulty in walking, not elsewhere classified: Secondary | ICD-10-CM | POA: Diagnosis not present

## 2017-12-20 DIAGNOSIS — M6281 Muscle weakness (generalized): Secondary | ICD-10-CM | POA: Diagnosis not present

## 2017-12-20 DIAGNOSIS — M25562 Pain in left knee: Secondary | ICD-10-CM | POA: Diagnosis not present

## 2017-12-20 DIAGNOSIS — M25662 Stiffness of left knee, not elsewhere classified: Secondary | ICD-10-CM | POA: Diagnosis not present

## 2017-12-23 DIAGNOSIS — M25662 Stiffness of left knee, not elsewhere classified: Secondary | ICD-10-CM | POA: Diagnosis not present

## 2017-12-23 DIAGNOSIS — M25562 Pain in left knee: Secondary | ICD-10-CM | POA: Diagnosis not present

## 2017-12-23 DIAGNOSIS — M6281 Muscle weakness (generalized): Secondary | ICD-10-CM | POA: Diagnosis not present

## 2017-12-23 DIAGNOSIS — R262 Difficulty in walking, not elsewhere classified: Secondary | ICD-10-CM | POA: Diagnosis not present

## 2017-12-26 DIAGNOSIS — M25662 Stiffness of left knee, not elsewhere classified: Secondary | ICD-10-CM | POA: Diagnosis not present

## 2017-12-26 DIAGNOSIS — M6281 Muscle weakness (generalized): Secondary | ICD-10-CM | POA: Diagnosis not present

## 2017-12-26 DIAGNOSIS — M25562 Pain in left knee: Secondary | ICD-10-CM | POA: Diagnosis not present

## 2017-12-26 DIAGNOSIS — R262 Difficulty in walking, not elsewhere classified: Secondary | ICD-10-CM | POA: Diagnosis not present

## 2017-12-30 DIAGNOSIS — M25662 Stiffness of left knee, not elsewhere classified: Secondary | ICD-10-CM | POA: Diagnosis not present

## 2017-12-30 DIAGNOSIS — R262 Difficulty in walking, not elsewhere classified: Secondary | ICD-10-CM | POA: Diagnosis not present

## 2017-12-30 DIAGNOSIS — M25562 Pain in left knee: Secondary | ICD-10-CM | POA: Diagnosis not present

## 2017-12-30 DIAGNOSIS — M6281 Muscle weakness (generalized): Secondary | ICD-10-CM | POA: Diagnosis not present

## 2018-01-02 DIAGNOSIS — M25662 Stiffness of left knee, not elsewhere classified: Secondary | ICD-10-CM | POA: Diagnosis not present

## 2018-01-02 DIAGNOSIS — R262 Difficulty in walking, not elsewhere classified: Secondary | ICD-10-CM | POA: Diagnosis not present

## 2018-01-02 DIAGNOSIS — M25562 Pain in left knee: Secondary | ICD-10-CM | POA: Diagnosis not present

## 2018-01-02 DIAGNOSIS — M6281 Muscle weakness (generalized): Secondary | ICD-10-CM | POA: Diagnosis not present

## 2018-01-06 DIAGNOSIS — M6281 Muscle weakness (generalized): Secondary | ICD-10-CM | POA: Diagnosis not present

## 2018-01-06 DIAGNOSIS — M25662 Stiffness of left knee, not elsewhere classified: Secondary | ICD-10-CM | POA: Diagnosis not present

## 2018-01-06 DIAGNOSIS — R262 Difficulty in walking, not elsewhere classified: Secondary | ICD-10-CM | POA: Diagnosis not present

## 2018-01-06 DIAGNOSIS — M25562 Pain in left knee: Secondary | ICD-10-CM | POA: Diagnosis not present

## 2018-01-09 DIAGNOSIS — M6281 Muscle weakness (generalized): Secondary | ICD-10-CM | POA: Diagnosis not present

## 2018-01-09 DIAGNOSIS — M25562 Pain in left knee: Secondary | ICD-10-CM | POA: Diagnosis not present

## 2018-01-09 DIAGNOSIS — M25662 Stiffness of left knee, not elsewhere classified: Secondary | ICD-10-CM | POA: Diagnosis not present

## 2018-01-09 DIAGNOSIS — R262 Difficulty in walking, not elsewhere classified: Secondary | ICD-10-CM | POA: Diagnosis not present

## 2018-01-13 DIAGNOSIS — M25662 Stiffness of left knee, not elsewhere classified: Secondary | ICD-10-CM | POA: Diagnosis not present

## 2018-01-13 DIAGNOSIS — R262 Difficulty in walking, not elsewhere classified: Secondary | ICD-10-CM | POA: Diagnosis not present

## 2018-01-13 DIAGNOSIS — M25562 Pain in left knee: Secondary | ICD-10-CM | POA: Diagnosis not present

## 2018-01-13 DIAGNOSIS — M6281 Muscle weakness (generalized): Secondary | ICD-10-CM | POA: Diagnosis not present

## 2018-01-16 DIAGNOSIS — M25562 Pain in left knee: Secondary | ICD-10-CM | POA: Diagnosis not present

## 2018-01-16 DIAGNOSIS — M6281 Muscle weakness (generalized): Secondary | ICD-10-CM | POA: Diagnosis not present

## 2018-01-16 DIAGNOSIS — M25662 Stiffness of left knee, not elsewhere classified: Secondary | ICD-10-CM | POA: Diagnosis not present

## 2018-01-16 DIAGNOSIS — R262 Difficulty in walking, not elsewhere classified: Secondary | ICD-10-CM | POA: Diagnosis not present

## 2018-01-21 DIAGNOSIS — M25562 Pain in left knee: Secondary | ICD-10-CM | POA: Diagnosis not present

## 2018-01-21 DIAGNOSIS — M25662 Stiffness of left knee, not elsewhere classified: Secondary | ICD-10-CM | POA: Diagnosis not present

## 2018-01-21 DIAGNOSIS — R262 Difficulty in walking, not elsewhere classified: Secondary | ICD-10-CM | POA: Diagnosis not present

## 2018-01-21 DIAGNOSIS — M6281 Muscle weakness (generalized): Secondary | ICD-10-CM | POA: Diagnosis not present

## 2018-01-24 DIAGNOSIS — R262 Difficulty in walking, not elsewhere classified: Secondary | ICD-10-CM | POA: Diagnosis not present

## 2018-01-24 DIAGNOSIS — M25662 Stiffness of left knee, not elsewhere classified: Secondary | ICD-10-CM | POA: Diagnosis not present

## 2018-01-24 DIAGNOSIS — M25562 Pain in left knee: Secondary | ICD-10-CM | POA: Diagnosis not present

## 2018-01-24 DIAGNOSIS — M6281 Muscle weakness (generalized): Secondary | ICD-10-CM | POA: Diagnosis not present

## 2018-01-28 DIAGNOSIS — M25662 Stiffness of left knee, not elsewhere classified: Secondary | ICD-10-CM | POA: Diagnosis not present

## 2018-01-28 DIAGNOSIS — M25562 Pain in left knee: Secondary | ICD-10-CM | POA: Diagnosis not present

## 2018-01-28 DIAGNOSIS — M6281 Muscle weakness (generalized): Secondary | ICD-10-CM | POA: Diagnosis not present

## 2018-01-28 DIAGNOSIS — R262 Difficulty in walking, not elsewhere classified: Secondary | ICD-10-CM | POA: Diagnosis not present

## 2018-01-31 DIAGNOSIS — M6281 Muscle weakness (generalized): Secondary | ICD-10-CM | POA: Diagnosis not present

## 2018-01-31 DIAGNOSIS — M25562 Pain in left knee: Secondary | ICD-10-CM | POA: Diagnosis not present

## 2018-01-31 DIAGNOSIS — R262 Difficulty in walking, not elsewhere classified: Secondary | ICD-10-CM | POA: Diagnosis not present

## 2018-01-31 DIAGNOSIS — M25662 Stiffness of left knee, not elsewhere classified: Secondary | ICD-10-CM | POA: Diagnosis not present

## 2018-02-04 DIAGNOSIS — M6281 Muscle weakness (generalized): Secondary | ICD-10-CM | POA: Diagnosis not present

## 2018-02-04 DIAGNOSIS — M25562 Pain in left knee: Secondary | ICD-10-CM | POA: Diagnosis not present

## 2018-02-04 DIAGNOSIS — R262 Difficulty in walking, not elsewhere classified: Secondary | ICD-10-CM | POA: Diagnosis not present

## 2018-02-04 DIAGNOSIS — M25662 Stiffness of left knee, not elsewhere classified: Secondary | ICD-10-CM | POA: Diagnosis not present

## 2018-02-07 DIAGNOSIS — M25562 Pain in left knee: Secondary | ICD-10-CM | POA: Diagnosis not present

## 2018-02-07 DIAGNOSIS — R262 Difficulty in walking, not elsewhere classified: Secondary | ICD-10-CM | POA: Diagnosis not present

## 2018-02-07 DIAGNOSIS — M25662 Stiffness of left knee, not elsewhere classified: Secondary | ICD-10-CM | POA: Diagnosis not present

## 2018-02-07 DIAGNOSIS — M6281 Muscle weakness (generalized): Secondary | ICD-10-CM | POA: Diagnosis not present

## 2018-02-27 DIAGNOSIS — D510 Vitamin B12 deficiency anemia due to intrinsic factor deficiency: Secondary | ICD-10-CM | POA: Diagnosis not present

## 2018-07-17 DIAGNOSIS — D510 Vitamin B12 deficiency anemia due to intrinsic factor deficiency: Secondary | ICD-10-CM | POA: Diagnosis not present

## 2018-07-25 DIAGNOSIS — Z79899 Other long term (current) drug therapy: Secondary | ICD-10-CM | POA: Diagnosis not present

## 2018-07-25 DIAGNOSIS — R5383 Other fatigue: Secondary | ICD-10-CM | POA: Diagnosis not present

## 2018-07-25 DIAGNOSIS — Z0000 Encounter for general adult medical examination without abnormal findings: Secondary | ICD-10-CM | POA: Diagnosis not present

## 2018-07-25 DIAGNOSIS — Z1331 Encounter for screening for depression: Secondary | ICD-10-CM | POA: Diagnosis not present

## 2018-07-25 DIAGNOSIS — D510 Vitamin B12 deficiency anemia due to intrinsic factor deficiency: Secondary | ICD-10-CM | POA: Diagnosis not present

## 2018-07-25 DIAGNOSIS — Z1339 Encounter for screening examination for other mental health and behavioral disorders: Secondary | ICD-10-CM | POA: Diagnosis not present

## 2018-07-25 DIAGNOSIS — E785 Hyperlipidemia, unspecified: Secondary | ICD-10-CM | POA: Diagnosis not present

## 2018-07-25 DIAGNOSIS — M1990 Unspecified osteoarthritis, unspecified site: Secondary | ICD-10-CM | POA: Diagnosis not present

## 2018-07-25 DIAGNOSIS — Z6838 Body mass index (BMI) 38.0-38.9, adult: Secondary | ICD-10-CM | POA: Diagnosis not present

## 2018-10-13 ENCOUNTER — Ambulatory Visit (INDEPENDENT_AMBULATORY_CARE_PROVIDER_SITE_OTHER): Payer: PPO | Admitting: Podiatry

## 2018-10-13 ENCOUNTER — Encounter: Payer: Self-pay | Admitting: Podiatry

## 2018-10-13 VITALS — BP 138/85 | HR 84 | Resp 16 | Ht 76.0 in | Wt 315.0 lb

## 2018-10-13 DIAGNOSIS — B351 Tinea unguium: Secondary | ICD-10-CM

## 2018-10-13 DIAGNOSIS — M79609 Pain in unspecified limb: Secondary | ICD-10-CM

## 2018-10-13 DIAGNOSIS — Q828 Other specified congenital malformations of skin: Secondary | ICD-10-CM | POA: Diagnosis not present

## 2018-10-13 DIAGNOSIS — M21611 Bunion of right foot: Secondary | ICD-10-CM | POA: Diagnosis not present

## 2018-10-13 DIAGNOSIS — M21961 Unspecified acquired deformity of right lower leg: Secondary | ICD-10-CM | POA: Diagnosis not present

## 2018-10-13 DIAGNOSIS — M2041 Other hammer toe(s) (acquired), right foot: Secondary | ICD-10-CM | POA: Diagnosis not present

## 2018-10-13 DIAGNOSIS — M2011 Hallux valgus (acquired), right foot: Secondary | ICD-10-CM | POA: Diagnosis not present

## 2018-10-13 NOTE — Progress Notes (Signed)
   Subjective:    Patient ID: Nicholas LittlesRobert H Rowland, male    DOB: 09-12-1946, 72 y.o.   MRN: [id number]  HPI    Review of Systems  All other systems reviewed and are negative.      Objective:   Physical Exam        Assessment & Plan:

## 2018-10-13 NOTE — Progress Notes (Signed)
  Subjective:  Patient ID: Nicholas Rowland, male    DOB: [birth year],  MRN: [id number]  Chief Complaint  Patient presents with  . debride    callus and nail trimming     72 y.o. male presents with the above complaint.  Denies diabetes.  Reports history of calluses to both feet that are very painful worse than the right.  Saw Dr. Blenda Mounts several years ago for his bunions.  Review of Systems: Negative except as noted in the HPI. Denies N/V/F/Ch.  Past Medical History:  Diagnosis Date  . Barrett esophagus   . GERD (gastroesophageal reflux disease)   . History of bronchitis   . Osteoarthritis of left knee    Severe   No current outpatient medications on file.  Social History   Tobacco Use  Smoking Status Former Smoker  Smokeless Tobacco Never Used  Tobacco Comment   quit 15 years ago    No Known Allergies Objective:   Vitals:   10/13/18 0933  BP: 138/85  Pulse: 84  Resp: 16   Body mass index is 38.34 kg/m. Constitutional Well developed. Well nourished.  Vascular Dorsalis pedis pulses palpable bilaterally. Posterior tibial pulses palpable bilaterally. Capillary refill normal to all digits.  No cyanosis or clubbing noted. Pedal hair growth normal.  Neurologic Normal speech. Oriented to person, place, and time. Epicritic sensation to light touch grossly present bilaterally.  Dermatologic Nails elongated thickened pain to palpation No open wounds. Hyperkeratoses sub-met 1 bilaterally, sub-met 5 right, second toe distal, medial heel right  Orthopedic: Normal joint ROM without pain or crepitus bilaterally. HAV bilaterally right greater than left, lesser digital contractures bilaterally   Radiographs: None taken today Assessment:   1. 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 Hammer toe of right foot   3. Deformity of metatarsal bone of right foot   4. Pain due to onychomycosis of nail   5. Porokeratosis    Plan:  Patient was evaluated and treated and all  questions answered.  Onychomycosis with pain -Nails debrided x10  Procedure: Nail Debridement Rationale: pain Type of Debridement: manual, sharp debridement. Instrumentation: Nail nipper, rotary burr. Number of Nails: 10  Porokeratosis -Debrided x4 -Educated on self-care as patient does not meet criteria for routine care  Procedure: Paring of Lesion Rationale: painful hyperkeratotic lesion Type of Debridement: manual, sharp debridement. Instrumentation: 312 blade Number of Lesions: 4   HAV, hammertoes -Contributing to pressure formation.  Educated on etiology of deformities -We will get x-rays next visit -Discussed the patient possible surgical correction depending upon x-rays to include bunion correction hammertoe correction and second metatarsal shortening osteotomy possible fifth metatarsal head excision.  Further discuss next visit   Return in about 6 weeks (around 11/24/2018) for HAV/Hammertoe f/u with XRs.

## 2018-10-29 DIAGNOSIS — H524 Presbyopia: Secondary | ICD-10-CM | POA: Diagnosis not present

## 2018-11-24 ENCOUNTER — Ambulatory Visit: Payer: PPO | Admitting: Podiatry

## 2018-11-24 ENCOUNTER — Ambulatory Visit (INDEPENDENT_AMBULATORY_CARE_PROVIDER_SITE_OTHER): Payer: PPO

## 2018-11-24 DIAGNOSIS — M2041 Other hammer toe(s) (acquired), right foot: Secondary | ICD-10-CM | POA: Diagnosis not present

## 2018-11-24 DIAGNOSIS — M21961 Unspecified acquired deformity of right lower leg: Secondary | ICD-10-CM

## 2018-11-24 DIAGNOSIS — M2011 Hallux valgus (acquired), right foot: Secondary | ICD-10-CM

## 2018-11-24 DIAGNOSIS — M21611 Bunion of right foot: Secondary | ICD-10-CM

## 2018-11-24 NOTE — Patient Instructions (Signed)
Pre-Operative Instructions  Congratulations, you have decided to take an important step towards improving your quality of life.  You can be assured that the doctors and staff at [facility] will be with you every step of the way.  Here are some important things you should know:  1. Plan to be at the surgery center/hospital at least 1 (one) hour prior to your scheduled time, unless otherwise directed by the surgical center/hospital staff.  You must have a responsible adult accompany you, remain during the surgery and drive you home.  Make sure you have directions to the surgical center/hospital to ensure you arrive on time. 2. If you are having surgery at [facility] or [place] hospitals, you will need a copy of your medical history and physical form from your family physician within one month prior to the date of surgery. We will give you a form for your primary physician to complete.  3. We make every effort to accommodate the date you request for surgery.  However, there are times where surgery dates or times have to be moved.  We will contact you as soon as possible if a change in schedule is required.   4. No aspirin/ibuprofen for one week before surgery.  If you are on aspirin, any non-steroidal anti-inflammatory medications (Mobic, Aleve, Ibuprofen) should not be taken seven (7) days prior to your surgery.  You make take Tylenol for pain prior to surgery.  5. Medications - If you are taking daily heart and blood pressure medications, seizure, reflux, allergy, asthma, anxiety, pain or diabetes medications, make sure you notify the surgery center/hospital before the day of surgery so they can tell you which medications you should take or avoid the day of surgery. 6. No food or drink after midnight the night before surgery unless directed otherwise by surgical center/hospital staff. 7. No alcoholic beverages 24-hours prior to surgery.  No smoking 24-hours prior or 24-hours after  surgery. 8. Wear loose pants or shorts. They should be loose enough to fit over bandages, boots, and casts. 9. Don't wear slip-on shoes. Sneakers are preferred. 10. Bring your boot with you to the surgery center/hospital.  Also bring crutches or a walker if your physician has prescribed it for you.  If you do not have this equipment, it will be provided for you after surgery. 11. If you have not been contacted by the surgery center/hospital by the day before your surgery, call to confirm the date and time of your surgery. 12. Leave-time from work may vary depending on the type of surgery you have.  Appropriate arrangements should be made prior to surgery with your employer. 13. Prescriptions will be provided immediately following surgery by your doctor.  Fill these as soon as possible after surgery and take the medication as directed. Pain medications will not be refilled on weekends and must be approved by the doctor. 14. Remove nail polish on the operative foot and avoid getting pedicures prior to surgery. 15. Wash the night before surgery.  The night before surgery wash the foot and leg well with water and the antibacterial soap provided. Be sure to pay special attention to beneath the toenails and in between the toes.  Wash for at least three (3) minutes. Rinse thoroughly with water and dry well with a towel.  Perform this wash unless told not to do so by your physician.  Enclosed: 1 Ice pack (please put in freezer the night before surgery)   1 Hibiclens skin cleaner     Pre-op instructions  If you have any questions regarding the instructions, please do not hesitate to call our office.  [place]: [address], [place], [place] [address] -- [phone]  [place]: [address] , [place] [address] -- [phone]  [place]: [address]  , [place] [address] -- [phone]  [place]: [address], [address], [place], [place] [address] -- [phone]  Website:  [email or web address] 

## 2018-11-27 ENCOUNTER — Telehealth: Payer: Self-pay | Admitting: *Deleted

## 2018-11-27 NOTE — Telephone Encounter (Signed)
"  My husband is scheduled for surgery on January 23.  I would like the procedure code for the procedure he's having for I guess a bunion surgery.  I'm trying to find out how much we will have to pay out of pocket."  I do not have his information at this time.  I will call you back once I speak with Dr. Samuella Cota.

## 2018-11-27 NOTE — Telephone Encounter (Signed)
I am calling to give you your codes that you were requesting.  I had already scheduled his surgery for February 12 and passed the paperwork on to the appointment scheduler.  The codes  are 16967, 28308, 28750, and 28285 x 3.  "I'm not sure what you mean by times 3."  That's the Hammer Toe code.  Your husband is having three hammer toes done.  "So this is what I tell them when I call the surgery center?"  Yes, that is correct.  "Okay, thank you so much."

## 2018-12-07 NOTE — Progress Notes (Signed)
  Subjective:  Patient ID: Nicholas Rowland, male    DOB: 1946-03-13,  MRN: [id number]  Chief Complaint  Patient presents with  . Hammer Toe    F/U for possible sx correction for HAV and hammer toes Tx: none    73 y.o. male presents with the above complaint.  Here for discussion of surgery states the pain in his foot has stayed the same and he would like to discuss surgery.  Review of Systems: Negative except as noted in the HPI. Denies N/V/F/Ch.  Past Medical History:  Diagnosis Date  . Barrett esophagus   . GERD (gastroesophageal reflux disease)   . History of bronchitis   . Osteoarthritis of left knee    Severe   No current outpatient medications on file.  Social History   Tobacco Use  Smoking Status Former Smoker  Smokeless Tobacco Never Used  Tobacco Comment   quit 15 years ago    No Known Allergies Objective:   There were no vitals filed for this visit. There is no height or weight on file to calculate BMI. Constitutional Well developed. Well nourished.  Vascular Dorsalis pedis pulses palpable bilaterally. Posterior tibial pulses palpable bilaterally. Capillary refill normal to all digits.  No cyanosis or clubbing noted. Pedal hair growth normal.  Neurologic Normal speech. Oriented to person, place, and time. Epicritic sensation to light touch grossly present bilaterally.  Dermatologic Nails elongated thickened pain to palpation No open wounds. Hyperkeratoses sub-met 1 bilaterally, sub-met 5 right, second toe distal, medial heel right  Orthopedic: Normal joint ROM without pain or crepitus bilaterally. HAV bilaterally right greater than left, lesser digital contractures bilaterally hammertoes 1,2, and 3.   Radiographs: None taken today Assessment:   1. 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 Hammer toe of right foot   3. Deformity of metatarsal bone of right foot    Plan:  Patient was evaluated and treated and all questions answered.  HAV,  hammertoes, metatarsal deformity -Patient has failed all conservative therapy and wishes to proceed with surgical intervention. All risks, benefits, and alternatives discussed with patient. No guarantees given. Consent reviewed and signed by patient. -Planned procedures: Right first metatarsal phalangeal joint fusion, great toe interphalangeal joint arthroplasty, second metatarsal shortening osteotomy, fifth metatarsal head excision, correction hammertoes 1 2 and 3 with pin and screw fixation    No follow-ups on file.

## 2018-12-09 ENCOUNTER — Telehealth: Payer: Self-pay | Admitting: *Deleted

## 2018-12-09 NOTE — Telephone Encounter (Signed)
"  I'm calling for [employer]. [place].  He has surgery scheduled for February 12.  We're going to cancel that for right now.  He will get with Dr. Samuella Cota to set up another time.  For right now, cancel that."

## 2018-12-16 NOTE — Telephone Encounter (Signed)
I attempted to call her back to let her know I received her message.  I was going to ask if there was a reason as well and recommend he come in for conservative treatment if needed.  I asked him to call if he needs [place].  I canceled the surgery via [employer].

## 2018-12-17 NOTE — Telephone Encounter (Signed)
Noted thanks °

## 2019-02-09 ENCOUNTER — Ambulatory Visit: Payer: PPO | Admitting: Podiatry

## 2019-04-07 DIAGNOSIS — K2270 Barrett's esophagus without dysplasia: Secondary | ICD-10-CM | POA: Diagnosis not present

## 2019-04-07 DIAGNOSIS — K219 Gastro-esophageal reflux disease without esophagitis: Secondary | ICD-10-CM | POA: Diagnosis not present

## 2019-06-02 ENCOUNTER — Other Ambulatory Visit: Payer: Self-pay

## 2019-06-02 ENCOUNTER — Encounter: Payer: Self-pay | Admitting: Podiatry

## 2019-06-02 ENCOUNTER — Ambulatory Visit (INDEPENDENT_AMBULATORY_CARE_PROVIDER_SITE_OTHER): Payer: PPO | Admitting: Podiatry

## 2019-06-02 VITALS — Temp 97.0°F | Resp 16

## 2019-06-02 DIAGNOSIS — B351 Tinea unguium: Secondary | ICD-10-CM | POA: Diagnosis not present

## 2019-06-02 DIAGNOSIS — M79676 Pain in unspecified toe(s): Secondary | ICD-10-CM | POA: Diagnosis not present

## 2019-06-21 NOTE — Progress Notes (Addendum)
  Subjective:  Patient ID: Nicholas Rowland, male    DOB: Feb 02, 1946,  MRN: [id number]  Chief Complaint  Patient presents with  . debride    BL nail trimming and callus care   73 y.o. male presents with the above complaint.  Reports painfully elongated nails to both feet.  Review of Systems: Negative except as noted in the HPI. Denies N/V/F/Ch.  Past Medical History:  Diagnosis Date  . Barrett esophagus   . GERD (gastroesophageal reflux disease)   . History of bronchitis   . Osteoarthritis of left knee    Severe    Current Outpatient Medications:  .  pantoprazole (PROTONIX) 40 MG tablet, , Disp: , Rfl:   Social History   Tobacco Use  Smoking Status Former Smoker  Smokeless Tobacco Never Used  Tobacco Comment   quit 15 years ago    No Known Allergies Objective:   Vitals:   06/02/19 1352  Resp: 16  Temp: (!) 97 F (36.1 C)   There is no height or weight on file to calculate BMI. Constitutional Well developed. Well nourished.  Vascular Dorsalis pedis pulses palpable bilaterally. Posterior tibial pulses palpable bilaterally. Capillary refill normal to all digits.  No cyanosis or clubbing noted. Pedal hair growth normal.  Neurologic Normal speech. Oriented to person, place, and time. Epicritic sensation to light touch grossly present bilaterally.  Dermatologic Nails elongated dystrophic pain to palpation No open wounds. No skin lesions.  Orthopedic: Normal joint ROM without pain or crepitus bilaterally. No visible deformities. No bony tenderness.   Radiographs: None Assessment:   1. 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  Onychomycosis with pain -Nails palliatively debridement as below -Educated on self-care  Procedure: Nail Debridement Rationale: Pain Type of Debridement: manual, sharp debridement. Instrumentation: Nail nipper, rotary burr. Number of Nails: 10  Calluses -Derided  with 312 blade without incident.    No follow-ups on file.

## 2019-06-24 DIAGNOSIS — K219 Gastro-esophageal reflux disease without esophagitis: Secondary | ICD-10-CM | POA: Diagnosis not present

## 2019-06-24 DIAGNOSIS — Z86010 Personal history of colonic polyps: Secondary | ICD-10-CM | POA: Diagnosis not present

## 2019-06-24 DIAGNOSIS — K2270 Barrett's esophagus without dysplasia: Secondary | ICD-10-CM | POA: Diagnosis not present

## 2019-07-28 DIAGNOSIS — Z6838 Body mass index (BMI) 38.0-38.9, adult: Secondary | ICD-10-CM | POA: Diagnosis not present

## 2019-07-28 DIAGNOSIS — M1990 Unspecified osteoarthritis, unspecified site: Secondary | ICD-10-CM | POA: Diagnosis not present

## 2019-07-28 DIAGNOSIS — D519 Vitamin B12 deficiency anemia, unspecified: Secondary | ICD-10-CM | POA: Diagnosis not present

## 2019-07-28 DIAGNOSIS — R5383 Other fatigue: Secondary | ICD-10-CM | POA: Diagnosis not present

## 2019-07-28 DIAGNOSIS — E785 Hyperlipidemia, unspecified: Secondary | ICD-10-CM | POA: Diagnosis not present

## 2019-07-28 DIAGNOSIS — Z79899 Other long term (current) drug therapy: Secondary | ICD-10-CM | POA: Diagnosis not present

## 2019-07-28 DIAGNOSIS — Z1331 Encounter for screening for depression: Secondary | ICD-10-CM | POA: Diagnosis not present

## 2019-07-28 DIAGNOSIS — Z2821 Immunization not carried out because of patient refusal: Secondary | ICD-10-CM | POA: Diagnosis not present

## 2019-07-28 DIAGNOSIS — Z0000 Encounter for general adult medical examination without abnormal findings: Secondary | ICD-10-CM | POA: Diagnosis not present

## 2019-09-01 ENCOUNTER — Other Ambulatory Visit: Payer: Self-pay

## 2019-09-01 ENCOUNTER — Ambulatory Visit (INDEPENDENT_AMBULATORY_CARE_PROVIDER_SITE_OTHER): Payer: PPO | Admitting: Podiatry

## 2019-09-01 DIAGNOSIS — M79609 Pain in unspecified limb: Secondary | ICD-10-CM

## 2019-09-01 DIAGNOSIS — B351 Tinea unguium: Secondary | ICD-10-CM | POA: Diagnosis not present

## 2020-05-29 NOTE — Progress Notes (Signed)
  Subjective:  Patient ID: Nicholas Rowland, male    DOB: 1946-08-27,  MRN: [id number]  No chief complaint on file.   74 y.o. male presents with the above complaint. History confirmed with patient.  Reports pain to the nails and cannot care for them himself  Objective:  Physical Exam: warm, good capillary refill, nail exam onychomycosis of the toenails and pain to palpation, no trophic changes or ulcerative lesions. DP pulses palpable, PT pulses palpable and protective sensation intact Left Foot: normal exam, no swelling, tenderness, instability; ligaments intact, full range of motion of all ankle/foot joints  Right Foot: normal exam, no swelling, tenderness, instability; ligaments intact, full range of motion of all ankle/foot joints   No images are attached to the encounter.  Assessment:   1. 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  Onychomycosis -Nails palliatively debrided secondary to pain  -Recommended revitaderm cream for xerosis and calluses  Procedure: Nail Debridement Rationale: pain Type of Debridement: manual, sharp debridement. Instrumentation: Nail nipper, rotary burr. Number of Nails: 10     Return if symptoms worsen or fail to improve.

## 2020-09-02 DIAGNOSIS — E7800 Pure hypercholesterolemia, unspecified: Secondary | ICD-10-CM | POA: Diagnosis not present

## 2020-09-02 DIAGNOSIS — Z6837 Body mass index (BMI) 37.0-37.9, adult: Secondary | ICD-10-CM | POA: Diagnosis not present

## 2020-09-02 DIAGNOSIS — Z1331 Encounter for screening for depression: Secondary | ICD-10-CM | POA: Diagnosis not present

## 2020-09-02 DIAGNOSIS — Z79899 Other long term (current) drug therapy: Secondary | ICD-10-CM | POA: Diagnosis not present

## 2020-09-02 DIAGNOSIS — R5383 Other fatigue: Secondary | ICD-10-CM | POA: Diagnosis not present

## 2020-09-02 DIAGNOSIS — Z0000 Encounter for general adult medical examination without abnormal findings: Secondary | ICD-10-CM | POA: Diagnosis not present

## 2020-09-02 DIAGNOSIS — Z2821 Immunization not carried out because of patient refusal: Secondary | ICD-10-CM | POA: Diagnosis not present

## 2020-09-02 DIAGNOSIS — M1990 Unspecified osteoarthritis, unspecified site: Secondary | ICD-10-CM | POA: Diagnosis not present

## 2020-10-31 DIAGNOSIS — L720 Epidermal cyst: Secondary | ICD-10-CM | POA: Diagnosis not present

## 2020-10-31 DIAGNOSIS — L578 Other skin changes due to chronic exposure to nonionizing radiation: Secondary | ICD-10-CM | POA: Diagnosis not present

## 2020-10-31 DIAGNOSIS — L0211 Cutaneous abscess of neck: Secondary | ICD-10-CM | POA: Diagnosis not present

## 2021-05-31 DIAGNOSIS — Z1211 Encounter for screening for malignant neoplasm of colon: Secondary | ICD-10-CM | POA: Diagnosis not present

## 2021-05-31 DIAGNOSIS — K5730 Diverticulosis of large intestine without perforation or abscess without bleeding: Secondary | ICD-10-CM | POA: Diagnosis not present

## 2021-09-04 DIAGNOSIS — E7800 Pure hypercholesterolemia, unspecified: Secondary | ICD-10-CM | POA: Diagnosis not present

## 2021-09-04 DIAGNOSIS — Z6838 Body mass index (BMI) 38.0-38.9, adult: Secondary | ICD-10-CM | POA: Diagnosis not present

## 2021-09-04 DIAGNOSIS — M1990 Unspecified osteoarthritis, unspecified site: Secondary | ICD-10-CM | POA: Diagnosis not present

## 2021-09-04 DIAGNOSIS — Z0000 Encounter for general adult medical examination without abnormal findings: Secondary | ICD-10-CM | POA: Diagnosis not present

## 2021-09-04 DIAGNOSIS — Y818 Miscellaneous general- and plastic-surgery devices associated with adverse incidents, not elsewhere classified: Secondary | ICD-10-CM | POA: Diagnosis not present

## 2021-11-23 DIAGNOSIS — Z96652 Presence of left artificial knee joint: Secondary | ICD-10-CM | POA: Diagnosis not present

## 2021-11-23 DIAGNOSIS — M5432 Sciatica, left side: Secondary | ICD-10-CM | POA: Diagnosis not present

## 2021-12-11 DIAGNOSIS — G609 Hereditary and idiopathic neuropathy, unspecified: Secondary | ICD-10-CM | POA: Diagnosis not present

## 2021-12-11 DIAGNOSIS — D510 Vitamin B12 deficiency anemia due to intrinsic factor deficiency: Secondary | ICD-10-CM | POA: Diagnosis not present

## 2021-12-11 DIAGNOSIS — M519 Unspecified thoracic, thoracolumbar and lumbosacral intervertebral disc disorder: Secondary | ICD-10-CM | POA: Diagnosis not present

## 2021-12-11 DIAGNOSIS — Z6838 Body mass index (BMI) 38.0-38.9, adult: Secondary | ICD-10-CM | POA: Diagnosis not present

## 2021-12-15 DIAGNOSIS — D510 Vitamin B12 deficiency anemia due to intrinsic factor deficiency: Secondary | ICD-10-CM | POA: Diagnosis not present

## 2021-12-15 DIAGNOSIS — M519 Unspecified thoracic, thoracolumbar and lumbosacral intervertebral disc disorder: Secondary | ICD-10-CM | POA: Diagnosis not present

## 2021-12-15 DIAGNOSIS — M5116 Intervertebral disc disorders with radiculopathy, lumbar region: Secondary | ICD-10-CM | POA: Diagnosis not present

## 2022-01-12 DIAGNOSIS — M5136 Other intervertebral disc degeneration, lumbar region: Secondary | ICD-10-CM | POA: Diagnosis not present

## 2022-01-12 DIAGNOSIS — M5451 Vertebrogenic low back pain: Secondary | ICD-10-CM | POA: Diagnosis not present

## 2022-01-12 DIAGNOSIS — M25552 Pain in left hip: Secondary | ICD-10-CM | POA: Diagnosis not present

## 2022-01-19 DIAGNOSIS — D510 Vitamin B12 deficiency anemia due to intrinsic factor deficiency: Secondary | ICD-10-CM | POA: Diagnosis not present

## 2022-01-25 DIAGNOSIS — M5416 Radiculopathy, lumbar region: Secondary | ICD-10-CM | POA: Diagnosis not present

## 2022-01-31 DIAGNOSIS — M25562 Pain in left knee: Secondary | ICD-10-CM | POA: Diagnosis not present

## 2022-01-31 DIAGNOSIS — Z96652 Presence of left artificial knee joint: Secondary | ICD-10-CM | POA: Diagnosis not present

## 2022-01-31 DIAGNOSIS — M1612 Unilateral primary osteoarthritis, left hip: Secondary | ICD-10-CM | POA: Diagnosis not present

## 2022-02-01 ENCOUNTER — Other Ambulatory Visit: Payer: Self-pay

## 2022-02-01 ENCOUNTER — Encounter: Payer: Self-pay | Admitting: Podiatry

## 2022-02-01 ENCOUNTER — Ambulatory Visit: Payer: PPO | Admitting: Podiatry

## 2022-02-01 DIAGNOSIS — B351 Tinea unguium: Secondary | ICD-10-CM | POA: Diagnosis not present

## 2022-02-01 DIAGNOSIS — D510 Vitamin B12 deficiency anemia due to intrinsic factor deficiency: Secondary | ICD-10-CM | POA: Diagnosis not present

## 2022-02-01 DIAGNOSIS — M79674 Pain in right toe(s): Secondary | ICD-10-CM | POA: Diagnosis not present

## 2022-02-01 DIAGNOSIS — G5793 Unspecified mononeuropathy of bilateral lower limbs: Secondary | ICD-10-CM | POA: Diagnosis not present

## 2022-02-01 DIAGNOSIS — Q828 Other specified congenital malformations of skin: Secondary | ICD-10-CM

## 2022-02-01 DIAGNOSIS — M79675 Pain in left toe(s): Secondary | ICD-10-CM | POA: Diagnosis not present

## 2022-02-10 ENCOUNTER — Encounter: Payer: Self-pay | Admitting: Podiatry

## 2022-02-10 NOTE — Progress Notes (Signed)
?  Subjective:  ?Patient ID: Nicholas Rowland, male    DOB: December 04, 1945,  MRN: [id number] ? ?Nicholas Rowland presents to clinic today for painful porokeratotic lesion(s) bilaterally and painful mycotic toenails that limit ambulation. Painful toenails interfere with ambulation. Aggravating factors include wearing enclosed shoe gear. Pain is relieved with periodic professional debridement. Painful porokeratotic lesions are aggravated when weightbearing with and without shoegear. Pain is relieved with periodic professional debridement. ? ?He has h/o pernicious anemia and lumbar radiculopathy. He does have numbness and tingling in his feet. ? ?New problem(s): None.  ? ?PCP is Noni Saupe, MD , and last visit was September 04, 2021. ? ?No Known Allergies ? ?Review of Systems: Negative except as noted in the HPI. ? ?Objective: ?General: Well developed, nourished, in no acute distress, alert and oriented x 3 . ? ?Vascular: Dorsalis Pedis artery and Posterior Tibial artery pedal pulses are 2/4 bilateral with immedate capillary fill time. Pedal hair growth present. No varicosities and no lower extremity edema present bilateral.  ? ?Dermatological: Skin is warm, dry and supple bilateral. Nails x 10 are well manicured; remaining integument appears unremarkable at this time. Porokeratotic lesion(s) R 3rd toe, submet head 1 left foot, submet head 3 right foot, and submet head 5 right foot. No erythema, no edema, no drainage, no fluctuance. There are no open sores, no preulcerative lesions, no rash or signs of infection present. ? ?Neruologic: Pt has subjective symptoms of neuropathy. Grossly intact via light touch bilateral. Vibratory intact via tuning fork bilateral. Protective threshold with Semmes Weinstein monofilament intact to all pedal sites bilateral.  ? ?Musculoskeletal: No gross boney pedal deformities bilateral. No pain, crepitus, or limitation noted with foot and ankle range of motion bilateral. Muscular strength  5/5 in all groups tested bilateral. ? ?Assessment/Plan: ?1. Pain due to onychomycosis of toenails of both feet   ?2. Porokeratosis   ?3. Pernicious anemia   ?4. Neuropathy of both feet   ?  ?-Patient was evaluated and treated. All patient's and/or POA's questions/concerns answered on today's visit. ?-Mycotic toenails 1-5 bilaterally were debrided in length and girth with sterile nail nippers and dremel without incident. ?-Painful porokeratotic lesion(s) R 3rd toe, submet head 1 left foot, submet head 3 right foot, and submet head 5 right foot pared and enucleated with sterile scalpel blade without incident. Total number of lesions debrided=4. ?-Patient/POA to call should there be question/concern in the interim.  ? ?Return in about 3 months (around 05/04/2022). ? ?Freddie Breech, DPM  ?

## 2022-02-11 DIAGNOSIS — M25562 Pain in left knee: Secondary | ICD-10-CM | POA: Insufficient documentation

## 2022-03-01 NOTE — Patient Instructions (Addendum)
2 VISITORS  ARE ALLOWED TO COME WITH YOU AND STAY IN THE WAITING ROOM ONLY DURING PRE OP AND PROCEDURE.   ?**NO VISITORS ARE ALLOWED IN THE SHORT STAY AREA OR RECOVERY ROOM!!** ? ?IF YOU WILL BE ADMITTED INTO THE HOSPITAL YOU ARE ALLOWED ONLY 4 SUPPORT PEOPLE DURING VISITATION HOURS ONLY (7 AM -8PM)   ?The support person(s) must pass our screening, gel in and out, and wear a mask at all times, including in the patient?s room. ?Patients must also wear a mask when staff or their support person are in the room. ?Visitors GUEST BADGE MUST BE WORN VISIBLY  ?One adult visitor may remain with you overnight and MUST be in the room by 8 P.M. ?  ? ? Your procedure is scheduled on: 03/13/22 ? ? Report to [facility] Main Entrance ? ?  Report to admitting at  11:45  AM ? ? Call this number if you have problems the morning of surgery [phone] ? ? Do not eat food :After Midnight. ? ? After Midnight you may have the following liquids until __11:30____ AM DAY OF SURGERY ? ?Water ?Black Coffee (sugar ok, NO MILK/CREAM OR CREAMERS)  ?Tea (sugar ok, NO MILK/CREAM OR CREAMERS) regular and decaf                             ?Plain Jell-O (NO RED)                                           ?Fruit ices (not with fruit pulp, NO RED)                                     ?Popsicles (NO RED)                                                                  ?Juice: apple, WHITE grape, WHITE cranberry ?Sports drinks like Gatorade (NO RED) ?Clear broth(vegetable,chicken,beef) ? ?             ? ?  ?  ?The day of surgery:  ?Drink ONE (1) Pre-Surgery Clear Ensure at 11:15 AM the morning of surgery. Drink in one sitting. Do not sip.  ?This drink was given to you during your hospital  ?pre-op appointment visit. ?Nothing else to drink after completing the  ?Pre-Surgery Clear Ensure at 11:30 am ?  ?       If you have questions, please contact your surgeon?s office. ? ? ?  ?  ?Oral Hygiene is also important to reduce your risk of infection.                                     ?Remember - BRUSH YOUR TEETH THE MORNING OF SURGERY WITH YOUR REGULAR TOOTHPASTE ? ? Do NOT smoke after Midnight ? ? Take these medicines the morning of surgery with A SIP OF WATER: Pantoprazole ? ? ?                  ?  You may not have any metal on your body including  jewelry, and body piercing ? ?           Do not wear  lotions, powders, cologne, or deodorant ? ? ? ?            Men may shave face and neck. ? ? Do not bring valuables to the hospital. [facility] IS NOT ?            RESPONSIBLE   FOR VALUABLES. ? ? Contacts, dentures or bridgework may not be worn into surgery. ? ? Bring small overnight bag day of surgery. ?  ? Patients discharged on the day of surgery will not be allowed to drive home.  Someone NEEDS to stay with you for the first 24 hours after anesthesia. ? ? Special Instructions: Bring a copy of your healthcare power of attorney and living will documents the day of surgery if you haven't scanned them before. ? ?            Please read over the following fact sheets you were given: IF YOU HAVE QUESTIONS ABOUT YOUR PRE-OP INSTRUCTIONS PLEASE CALL [phone] ? ?   [facility] - Preparing for Surgery ?Before surgery, you can play an important role.  Because skin is not sterile, your skin needs to be as free of germs as possible.  You can reduce the number of germs on your skin by washing with CHG (chlorahexidine gluconate) soap before surgery.  CHG is an antiseptic cleaner which kills germs and bonds with the skin to continue killing germs even after washing. ?Please DO NOT use if you have an allergy to CHG or antibacterial soaps.  If your skin becomes reddened/irritated stop using the CHG and inform your nurse when you arrive at Short Stay.  You may shave your face/neck. ?Please follow these instructions carefully: ? 1.  Shower with CHG Soap the night before surgery and the  morning of Surgery. ? 2.  If you choose to wash your hair, wash your hair first as  usual with your  normal  shampoo. ? 3.  After you shampoo, rinse your hair and body thoroughly to remove the  shampoo.                    ?        4.  Use CHG as you would any other liquid soap.  You can apply chg directly  to the skin and wash  ?                     Gently with a scrungie or clean washcloth. ? 5.  Apply the CHG Soap to your body ONLY FROM THE NECK DOWN.   Do not use on face/ open      ?                     Wound or open sores. Avoid contact with eyes, ears mouth and genitals (private parts).  ?                     Engineering geologist,  Genitals (private parts) with your normal soap. ?            6.  Wash thoroughly, paying special attention to the area where your surgery  will be performed. ? 7.  Thoroughly rinse your body with warm water from the neck down. ? 8.  DO NOT  shower/wash with your normal soap after using and rinsing off  the CHG Soap. ?               9.  Pat yourself dry with a clean towel. ?           10.  Wear clean pajamas. ?           11.  Place clean sheets on your bed the night of your first shower and do not  sleep with pets. ?Day of Surgery : ?Do not apply any lotions/deodorants the morning of surgery.  Please wear clean clothes to the hospital/surgery center. ? ?FAILURE TO FOLLOW THESE INSTRUCTIONS MAY RESULT IN THE CANCELLATION OF YOUR SURGERY ?PATIENT SIGNATURE_________________________________ ? ?NURSE SIGNATURE__________________________________ ? ?________________________________________________________________________  ? ?Incentive Spirometer ? ?An incentive spirometer is a tool that can help keep your lungs clear and active. This tool measures how well you are filling your lungs with each breath. Taking long deep breaths may help reverse or decrease the chance of developing breathing (pulmonary) problems (especially infection) following: ?A long period of time when you are unable to move or be active. ?BEFORE THE PROCEDURE  ?If the spirometer includes an indicator to show your best  effort, your nurse or respiratory therapist will set it to a desired goal. ?If possible, sit up straight or lean slightly forward. Try not to slouch. ?Hold the incentive spirometer in an upright position. ?INSTRUCTIONS FOR USE  ?Sit on the edge of your bed if possible, or sit up as far as you can in bed or on a chair. ?Hold the incentive spirometer in an upright position. ?Breathe out normally. ?Place the mouthpiece in your mouth and seal your lips tightly around it. ?Breathe in slowly and as deeply as possible, raising the piston or the ball toward the top of the column. ?Hold your breath for 3-5 seconds or for as long as possible. Allow the piston or ball to fall to the bottom of the column. ?Remove the mouthpiece from your mouth and breathe out normally. ?Rest for a few seconds and repeat Steps 1 through 7 at least 10 times every 1-2 hours when you are awake. Take your time and take a few normal breaths between deep breaths. ?The spirometer may include an indicator to show your best effort. Use the indicator as a goal to work toward during each repetition. ?After each set of 10 deep breaths, practice coughing to be sure your lungs are clear. If you have an incision (the cut made at the time of surgery), support your incision when coughing by placing a pillow or rolled up towels firmly against it. ?Once you are able to get out of bed, walk around indoors and cough well. You may stop using the incentive spirometer when instructed by your caregiver.  ?RISKS AND COMPLICATIONS ?Take your time so you do not get dizzy or light-headed. ?If you are in pain, you may need to take or ask for pain medication before doing incentive spirometry. It is harder to take a deep breath if you are having pain. ?AFTER USE ?Rest and breathe slowly and easily. ?It can be helpful to keep track of a log of your progress. Your caregiver can provide you with a simple table to help with this. ?If you are using the spirometer at home, follow  these instructions: ?SEEK MEDICAL CARE IF:  ?You are having difficultly using the spirometer. ?You have trouble using the spirometer as often as instructed. ?Your pain medication is not giving  enough r

## 2022-03-02 ENCOUNTER — Encounter (HOSPITAL_COMMUNITY)
Admission: RE | Admit: 2022-03-02 | Discharge: 2022-03-02 | Disposition: A | Discharge status: Home / Self Care | Payer: PPO | Source: Ambulatory Visit | Attending: Orthopedic Surgery | Admitting: Orthopedic Surgery

## 2022-03-02 ENCOUNTER — Other Ambulatory Visit: Payer: Self-pay

## 2022-03-02 ENCOUNTER — Encounter (HOSPITAL_COMMUNITY): Payer: Self-pay

## 2022-03-02 VITALS — BP 143/92 | HR 66 | Temp 98.0°F | Resp 20 | Ht 76.0 in | Wt 301.0 lb

## 2022-03-02 DIAGNOSIS — Z01818 Encounter for other preprocedural examination: Secondary | ICD-10-CM

## 2022-03-02 DIAGNOSIS — M1612 Unilateral primary osteoarthritis, left hip: Secondary | ICD-10-CM

## 2022-03-02 DIAGNOSIS — Z01812 Encounter for preprocedural laboratory examination: Secondary | ICD-10-CM | POA: Diagnosis not present

## 2022-03-02 LAB — CBC
HCT: 42.1 % (ref 39.0–52.0)
Hemoglobin: 14.2 g/dL (ref 13.0–17.0)
MCH: 32.3 pg (ref 26.0–34.0)
MCHC: 33.7 g/dL (ref 30.0–36.0)
MCV: 95.7 fL (ref 80.0–100.0)
Platelets: 150 10*3/uL (ref 150–400)
RBC: 4.4 MIL/uL (ref 4.22–5.81)
RDW: 13.2 % (ref 11.5–15.5)
WBC: 9.9 10*3/uL (ref 4.0–10.5)
nRBC: 0 % (ref 0.0–0.2)

## 2022-03-02 LAB — COMPREHENSIVE METABOLIC PANEL
ALT: 23 U/L (ref 0–44)
AST: 40 U/L (ref 15–41)
Albumin: 4.2 g/dL (ref 3.5–5.0)
Alkaline Phosphatase: 82 U/L (ref 38–126)
Anion gap: 6 (ref 5–15)
BUN: 26 mg/dL — ABNORMAL HIGH (ref 8–23)
CO2: 25 mmol/L (ref 22–32)
Calcium: 9.4 mg/dL (ref 8.9–10.3)
Chloride: 106 mmol/L (ref 98–111)
Creatinine, Ser: 0.72 mg/dL (ref 0.61–1.24)
GFR, Estimated: >60 mL/min (ref >60)
Glucose, Bld: 140 mg/dL — ABNORMAL HIGH (ref 70–99)
Potassium: 4.9 mmol/L (ref 3.5–5.1)
Sodium: 137 mmol/L (ref 135–145)
Total Bilirubin: 2.5 mg/dL — ABNORMAL HIGH (ref 0.3–1.2)
Total Protein: 7.6 g/dL (ref 6.5–8.1)

## 2022-03-02 LAB — TYPE AND SCREEN
ABO/RH(D): A POS
Antibody Screen: NEGATIVE

## 2022-03-02 NOTE — Progress Notes (Signed)
Anesthesia note: ? ?Bowel prep reminder:NA ? ?PCP - Dr. Juline Patch ?Cardiologist -none ?Other-  ? ?Chest x-ray - no ?EKG - no ?Stress Test - no ?ECHO - no ?Cardiac Cath - no ? ?Pacemaker/ICD device last checked:NA ? ?Sleep Study - no ?CPAP -  ? ?Pt is pre diabetic-NA ?Fasting Blood Sugar -  ?Checks Blood Sugar _____ ? ?Blood Thinner:NA ?Blood Thinner Instructions: ?Aspirin Instructions: ?Last Dose: ? ?Anesthesia review: yes ? ?Patient denies shortness of breath, fever, cough and chest pain at PAT appointment ?Pt reports no SOB climbing several flights of stairs or with activities. He has a congested laugh but not coughing. He said he did a lot of weed whacking that started the congestion. I told him to call his PCP if it gets worse ? ?Patient verbalized understanding of instructions that were given to them at the PAT appointment. Patient was also instructed that they will need to review over the PAT instructions again at home before surgery. yes ?

## 2022-03-06 DIAGNOSIS — E7800 Pure hypercholesterolemia, unspecified: Secondary | ICD-10-CM | POA: Diagnosis not present

## 2022-03-06 DIAGNOSIS — I10 Essential (primary) hypertension: Secondary | ICD-10-CM | POA: Diagnosis not present

## 2022-03-06 DIAGNOSIS — D510 Vitamin B12 deficiency anemia due to intrinsic factor deficiency: Secondary | ICD-10-CM | POA: Diagnosis not present

## 2022-03-06 DIAGNOSIS — Z6836 Body mass index (BMI) 36.0-36.9, adult: Secondary | ICD-10-CM | POA: Diagnosis not present

## 2022-03-09 DIAGNOSIS — R739 Hyperglycemia, unspecified: Secondary | ICD-10-CM | POA: Diagnosis not present

## 2022-03-09 DIAGNOSIS — Z7901 Long term (current) use of anticoagulants: Secondary | ICD-10-CM | POA: Diagnosis not present

## 2022-03-09 DIAGNOSIS — Z01818 Encounter for other preprocedural examination: Secondary | ICD-10-CM | POA: Diagnosis not present

## 2022-03-12 NOTE — Progress Notes (Signed)
Spoke with pts wife whom is aware of patient arriving at [facility] admitting at 0845 on Tuesday 03/13/2022 for scheduled surgical procedure. Aware of no food after midnight; clear liquids from midnight till 0830 consuming entire pre surgery drink by 0830 then nothing by mouth.  ?

## 2022-03-13 ENCOUNTER — Ambulatory Visit (HOSPITAL_COMMUNITY): Payer: PPO

## 2022-03-13 ENCOUNTER — Observation Stay (HOSPITAL_COMMUNITY): Payer: PPO

## 2022-03-13 ENCOUNTER — Other Ambulatory Visit: Payer: Self-pay

## 2022-03-13 ENCOUNTER — Ambulatory Visit (HOSPITAL_BASED_OUTPATIENT_CLINIC_OR_DEPARTMENT_OTHER): Payer: PPO | Admitting: Certified Registered Nurse Anesthetist

## 2022-03-13 ENCOUNTER — Encounter (HOSPITAL_COMMUNITY): Payer: Self-pay | Admitting: Orthopedic Surgery

## 2022-03-13 ENCOUNTER — Encounter (HOSPITAL_COMMUNITY)
Admission: RE | Disposition: A | Discharge status: Home / Self Care | Payer: Self-pay | Source: Ambulatory Visit | Attending: Orthopedic Surgery

## 2022-03-13 ENCOUNTER — Ambulatory Visit (HOSPITAL_COMMUNITY): Payer: PPO | Admitting: Certified Registered Nurse Anesthetist

## 2022-03-13 ENCOUNTER — Observation Stay (HOSPITAL_COMMUNITY)
Admission: RE | Admit: 2022-03-13 | Discharge: 2022-03-14 | Disposition: A | Discharge status: Home / Self Care | Payer: PPO | Source: Ambulatory Visit | Attending: Orthopedic Surgery | Admitting: Orthopedic Surgery

## 2022-03-13 DIAGNOSIS — Z87891 Personal history of nicotine dependence: Secondary | ICD-10-CM | POA: Insufficient documentation

## 2022-03-13 DIAGNOSIS — M1612 Unilateral primary osteoarthritis, left hip: Secondary | ICD-10-CM | POA: Diagnosis not present

## 2022-03-13 DIAGNOSIS — Z79899 Other long term (current) drug therapy: Secondary | ICD-10-CM | POA: Insufficient documentation

## 2022-03-13 DIAGNOSIS — Z96642 Presence of left artificial hip joint: Secondary | ICD-10-CM | POA: Diagnosis not present

## 2022-03-13 DIAGNOSIS — Z471 Aftercare following joint replacement surgery: Secondary | ICD-10-CM | POA: Diagnosis not present

## 2022-03-13 DIAGNOSIS — Z01818 Encounter for other preprocedural examination: Secondary | ICD-10-CM

## 2022-03-13 DIAGNOSIS — Z96652 Presence of left artificial knee joint: Secondary | ICD-10-CM | POA: Diagnosis not present

## 2022-03-13 HISTORY — PX: TOTAL HIP ARTHROPLASTY: SHX124

## 2022-03-13 LAB — ABO/RH: ABO/RH(D): A POS

## 2022-03-13 LAB — SURGICAL PCR SCREEN
MRSA, PCR: NEGATIVE
Staphylococcus aureus: NEGATIVE

## 2022-03-13 SURGERY — ARTHROPLASTY, HIP, TOTAL, ANTERIOR APPROACH
Anesthesia: Spinal | Site: Hip | Laterality: Left

## 2022-03-13 MED ORDER — CEFAZOLIN IN SODIUM CHLORIDE 3-0.9 GM/100ML-% IV SOLN
3.0000 g | INTRAVENOUS | Status: AC
  Administered 2022-03-13: 3 g via INTRAVENOUS
  Filled 2022-03-13: qty 100

## 2022-03-13 MED ORDER — DOCUSATE SODIUM 100 MG PO CAPS
100.0000 mg | ORAL_CAPSULE | Freq: Two times a day (BID) | ORAL | Status: DC
  Administered 2022-03-13 – 2022-03-14 (×2): 100 mg via ORAL
  Filled 2022-03-13 (×2): qty 1

## 2022-03-13 MED ORDER — HYDROCODONE-ACETAMINOPHEN 5-325 MG PO TABS
1.0000 | ORAL_TABLET | ORAL | Status: DC | PRN
  Administered 2022-03-13: 2 via ORAL
  Filled 2022-03-13: qty 2

## 2022-03-13 MED ORDER — ONDANSETRON HCL 4 MG PO TABS
4.0000 mg | ORAL_TABLET | Freq: Four times a day (QID) | ORAL | Status: DC | PRN
Start: 2022-03-13 — End: 2022-03-14

## 2022-03-13 MED ORDER — PROPOFOL 500 MG/50ML IV EMUL
INTRAVENOUS | Status: DC | PRN
Start: 2022-03-13 — End: 2022-03-13
  Administered 2022-03-13: 50 ug/kg/min via INTRAVENOUS

## 2022-03-13 MED ORDER — TRANEXAMIC ACID-NACL 1000-0.7 MG/100ML-% IV SOLN
1000.0000 mg | INTRAVENOUS | Status: AC
  Administered 2022-03-13: 1000 mg via INTRAVENOUS
  Filled 2022-03-13: qty 100

## 2022-03-13 MED ORDER — SODIUM CHLORIDE 0.9 % IR SOLN
Status: DC | PRN
  Administered 2022-03-13: 1000 mL

## 2022-03-13 MED ORDER — PHENOL 1.4 % MT LIQD: 1.0000 | OROMUCOSAL | Status: DC | PRN

## 2022-03-13 MED ORDER — HYDROCODONE-ACETAMINOPHEN 7.5-325 MG PO TABS
1.0000 | ORAL_TABLET | ORAL | Status: DC | PRN
  Administered 2022-03-13 – 2022-03-14 (×2): 2 via ORAL
  Administered 2022-03-14: 1 via ORAL
  Filled 2022-03-13: qty 1
  Filled 2022-03-13 (×2): qty 2

## 2022-03-13 MED ORDER — OXYCODONE HCL 5 MG/5ML PO SOLN: 5.0000 mg | Freq: Once | ORAL | Status: DC | PRN

## 2022-03-13 MED ORDER — PANTOPRAZOLE SODIUM 40 MG PO TBEC
40.0000 mg | DELAYED_RELEASE_TABLET | Freq: Every day | ORAL | Status: DC
  Administered 2022-03-13 – 2022-03-14 (×2): 40 mg via ORAL
  Filled 2022-03-13 (×2): qty 1

## 2022-03-13 MED ORDER — CEFAZOLIN SODIUM-DEXTROSE 2-4 GM/100ML-% IV SOLN
2.0000 g | Freq: Four times a day (QID) | INTRAVENOUS | Status: AC
  Administered 2022-03-13 (×2): 2 g via INTRAVENOUS
  Filled 2022-03-13 (×2): qty 100

## 2022-03-13 MED ORDER — POLYETHYLENE GLYCOL 3350 17 G PO PACK: 17.0000 g | PACK | Freq: Every day | ORAL | Status: DC | PRN

## 2022-03-13 MED ORDER — PROMETHAZINE HCL 25 MG/ML IJ SOLN: 6.2500 mg | INTRAMUSCULAR | Status: DC | PRN

## 2022-03-13 MED ORDER — METHOCARBAMOL 500 MG PO TABS
500.0000 mg | ORAL_TABLET | Freq: Four times a day (QID) | ORAL | Status: DC | PRN
  Administered 2022-03-13: 500 mg via ORAL
  Filled 2022-03-13: qty 1

## 2022-03-13 MED ORDER — ASPIRIN 81 MG PO CHEW
81.0000 mg | CHEWABLE_TABLET | Freq: Two times a day (BID) | ORAL | Status: DC
  Administered 2022-03-13 – 2022-03-14 (×2): 81 mg via ORAL
  Filled 2022-03-13 (×2): qty 1

## 2022-03-13 MED ORDER — HYDROMORPHONE HCL 1 MG/ML IJ SOLN: 0.2500 mg | INTRAMUSCULAR | Status: DC | PRN

## 2022-03-13 MED ORDER — PHENYLEPHRINE HCL-NACL 20-0.9 MG/250ML-% IV SOLN
INTRAVENOUS | Status: DC | PRN
  Administered 2022-03-13: 30 ug/min via INTRAVENOUS

## 2022-03-13 MED ORDER — ONDANSETRON HCL 4 MG/2ML IJ SOLN
INTRAMUSCULAR | Status: DC | PRN
  Administered 2022-03-13: 4 mg via INTRAVENOUS

## 2022-03-13 MED ORDER — MENTHOL 3 MG MT LOZG: 1.0000 | LOZENGE | OROMUCOSAL | Status: DC | PRN

## 2022-03-13 MED ORDER — BUPIVACAINE IN DEXTROSE 0.75-8.25 % IT SOLN
INTRATHECAL | Status: DC | PRN
  Administered 2022-03-13: 2 mL via INTRATHECAL

## 2022-03-13 MED ORDER — DEXAMETHASONE SODIUM PHOSPHATE 10 MG/ML IJ SOLN
INTRAMUSCULAR | Status: AC
  Filled 2022-03-13: qty 1

## 2022-03-13 MED ORDER — ONDANSETRON HCL 4 MG/2ML IJ SOLN
INTRAMUSCULAR | Status: AC
  Filled 2022-03-13: qty 2

## 2022-03-13 MED ORDER — METHOCARBAMOL 1000 MG/10ML IJ SOLN
500.0000 mg | Freq: Four times a day (QID) | INTRAVENOUS | Status: DC | PRN
  Filled 2022-03-13: qty 5

## 2022-03-13 MED ORDER — ORAL CARE MOUTH RINSE: 15.0000 mL | Freq: Once | OROMUCOSAL | Status: AC

## 2022-03-13 MED ORDER — DIPHENHYDRAMINE HCL 12.5 MG/5ML PO ELIX: 12.5000 mg | ORAL_SOLUTION | ORAL | Status: DC | PRN

## 2022-03-13 MED ORDER — MORPHINE SULFATE (PF) 2 MG/ML IV SOLN
0.5000 mg | INTRAVENOUS | Status: DC | PRN
  Administered 2022-03-13 (×2): 1 mg via INTRAVENOUS
  Filled 2022-03-13 (×2): qty 1

## 2022-03-13 MED ORDER — CHLORHEXIDINE GLUCONATE 0.12 % MT SOLN
15.0000 mL | Freq: Once | OROMUCOSAL | Status: AC
  Administered 2022-03-13: 15 mL via OROMUCOSAL

## 2022-03-13 MED ORDER — DEXAMETHASONE SODIUM PHOSPHATE 10 MG/ML IJ SOLN
10.0000 mg | Freq: Once | INTRAMUSCULAR | Status: AC
  Administered 2022-03-14: 10 mg via INTRAVENOUS
  Filled 2022-03-13: qty 1

## 2022-03-13 MED ORDER — CELECOXIB 200 MG PO CAPS
200.0000 mg | ORAL_CAPSULE | Freq: Two times a day (BID) | ORAL | Status: DC
  Administered 2022-03-13 – 2022-03-14 (×2): 200 mg via ORAL
  Filled 2022-03-13 (×2): qty 1

## 2022-03-13 MED ORDER — PROPOFOL 1000 MG/100ML IV EMUL
INTRAVENOUS | Status: AC
  Filled 2022-03-13: qty 100

## 2022-03-13 MED ORDER — PROPOFOL 10 MG/ML IV BOLUS
INTRAVENOUS | Status: DC | PRN
  Administered 2022-03-13: 20 mg via INTRAVENOUS
  Administered 2022-03-13: 30 mg via INTRAVENOUS

## 2022-03-13 MED ORDER — OXYCODONE HCL 5 MG PO TABS: 5.0000 mg | ORAL_TABLET | Freq: Once | ORAL | Status: DC | PRN

## 2022-03-13 MED ORDER — ONDANSETRON HCL 4 MG/2ML IJ SOLN: 4.0000 mg | Freq: Four times a day (QID) | INTRAMUSCULAR | Status: DC | PRN

## 2022-03-13 MED ORDER — FERROUS SULFATE 325 (65 FE) MG PO TABS
325.0000 mg | ORAL_TABLET | Freq: Three times a day (TID) | ORAL | Status: DC
  Administered 2022-03-13 – 2022-03-14 (×2): 325 mg via ORAL
  Filled 2022-03-13 (×2): qty 1

## 2022-03-13 MED ORDER — SODIUM CHLORIDE 0.9 % IV SOLN: INTRAVENOUS | Status: DC

## 2022-03-13 MED ORDER — LACTATED RINGERS IV SOLN: INTRAVENOUS | Status: DC

## 2022-03-13 MED ORDER — POVIDONE-IODINE 10 % EX SWAB
2.0000 "application " | Freq: Once | CUTANEOUS | Status: AC
  Administered 2022-03-13: 2 via TOPICAL

## 2022-03-13 MED ORDER — METOCLOPRAMIDE HCL 5 MG/ML IJ SOLN: 5.0000 mg | Freq: Three times a day (TID) | INTRAMUSCULAR | Status: DC | PRN

## 2022-03-13 MED ORDER — BISACODYL 10 MG RE SUPP: 10.0000 mg | Freq: Every day | RECTAL | Status: DC | PRN

## 2022-03-13 MED ORDER — TRANEXAMIC ACID-NACL 1000-0.7 MG/100ML-% IV SOLN
1000.0000 mg | Freq: Once | INTRAVENOUS | Status: AC
  Administered 2022-03-13: 1000 mg via INTRAVENOUS
  Filled 2022-03-13: qty 100

## 2022-03-13 MED ORDER — ACETAMINOPHEN 325 MG PO TABS: 325.0000 mg | ORAL_TABLET | Freq: Four times a day (QID) | ORAL | Status: DC | PRN

## 2022-03-13 MED ORDER — DEXAMETHASONE SODIUM PHOSPHATE 10 MG/ML IJ SOLN
8.0000 mg | Freq: Once | INTRAMUSCULAR | Status: AC
  Administered 2022-03-13: 8 mg via INTRAVENOUS

## 2022-03-13 MED ORDER — STERILE WATER FOR IRRIGATION IR SOLN
Status: DC | PRN
  Administered 2022-03-13: 2000 mL

## 2022-03-13 MED ORDER — METOCLOPRAMIDE HCL 5 MG PO TABS: 5.0000 mg | ORAL_TABLET | Freq: Three times a day (TID) | ORAL | Status: DC | PRN

## 2022-03-13 SURGICAL SUPPLY — 54 items
ADH SKN CLS APL DERMABOND .7 (GAUZE/BANDAGES/DRESSINGS) ×1
BAG COUNTER SPONGE SURGICOUNT (BAG) IMPLANT
BAG DECANTER FOR FLEXI CONT (MISCELLANEOUS) IMPLANT
BAG SPEC THK2 15X12 ZIP CLS (MISCELLANEOUS)
BAG SPNG CNTER NS LX DISP (BAG)
BAG ZIPLOCK 12X15 (MISCELLANEOUS) IMPLANT
BLADE SAG 18X100X1.27 (BLADE) ×2 IMPLANT
COVER PERINEAL POST (MISCELLANEOUS) ×2 IMPLANT
COVER SURGICAL LIGHT HANDLE (MISCELLANEOUS) ×2 IMPLANT
CUP ACET PINNACLE SECTR 64MM (Hips) IMPLANT
DERMABOND ADVANCED (GAUZE/BANDAGES/DRESSINGS) ×1
DERMABOND ADVANCED .7 DNX12 (GAUZE/BANDAGES/DRESSINGS) ×2 IMPLANT
DRAPE FOOT SWITCH (DRAPES) ×3 IMPLANT
DRAPE STERI IOBAN 125X83 (DRAPES) ×2 IMPLANT
DRAPE U-SHAPE 47X51 STRL (DRAPES) ×6 IMPLANT
DRESSING AQUACEL AG SP 3.5X10 (GAUZE/BANDAGES/DRESSINGS) ×1 IMPLANT
DRSG AQUACEL AG ADV 3.5X10 (GAUZE/BANDAGES/DRESSINGS) ×1 IMPLANT
DRSG AQUACEL AG SP 3.5X10 (GAUZE/BANDAGES/DRESSINGS) ×2
DURAPREP 26ML APPLICATOR (WOUND CARE) ×2 IMPLANT
ELECT REM PT RETURN 15FT ADLT (MISCELLANEOUS) ×2 IMPLANT
ELIMINATOR HOLE APEX DEPUY (Hips) ×1 IMPLANT
GLOVE BIO SURGEON STRL SZ 6 (GLOVE) ×4 IMPLANT
GLOVE BIO SURGEON STRL SZ 6.5 (GLOVE) ×1 IMPLANT
GLOVE BIO SURGEON STRL SZ7 (GLOVE) ×1 IMPLANT
GLOVE BIO SURGEON STRL SZ7.5 (GLOVE) ×2 IMPLANT
GLOVE BIOGEL PI IND STRL 6.5 (GLOVE) ×1 IMPLANT
GLOVE BIOGEL PI IND STRL 7.0 (GLOVE) IMPLANT
GLOVE BIOGEL PI IND STRL 7.5 (GLOVE) ×1 IMPLANT
GLOVE BIOGEL PI INDICATOR 6.5 (GLOVE) ×1
GLOVE BIOGEL PI INDICATOR 7.0 (GLOVE) ×4
GLOVE BIOGEL PI INDICATOR 7.5 (GLOVE) ×1
GOWN STRL REUS W/ TWL LRG LVL3 (GOWN DISPOSABLE) ×2 IMPLANT
GOWN STRL REUS W/ TWL XL LVL3 (GOWN DISPOSABLE) IMPLANT
GOWN STRL REUS W/TWL LRG LVL3 (GOWN DISPOSABLE) ×4
GOWN STRL REUS W/TWL XL LVL3 (GOWN DISPOSABLE) ×4
HEAD CERAMIC DELTA 36 PLUS 1.5 (Hips) ×1 IMPLANT
HOLDER FOLEY CATH W/STRAP (MISCELLANEOUS) ×2 IMPLANT
KIT TURNOVER KIT A (KITS) ×1 IMPLANT
LINER NEUTRAL 64X36 P4 (Hips) ×1 IMPLANT
NS IRRIG 1000ML POUR BTL (IV SOLUTION) ×1 IMPLANT
PACK ANTERIOR HIP CUSTOM (KITS) ×2 IMPLANT
PINNACLE SECTOR CUP 64MM (Hips) ×2 IMPLANT
SCREW 6.5MMX35MM (Screw) ×1 IMPLANT
SPONGE T-LAP 18X18 ~~LOC~~+RFID (SPONGE) ×8 IMPLANT
STEM FEMORAL SZ11 HIGH ACTIS (Stem) ×1 IMPLANT
SUT MNCRL AB 4-0 PS2 18 (SUTURE) ×2 IMPLANT
SUT STRATAFIX 0 PDS 27 VIOLET (SUTURE) ×2
SUT VIC AB 1 CT1 36 (SUTURE) ×9 IMPLANT
SUT VIC AB 2-0 CT1 27 (SUTURE) ×4
SUT VIC AB 2-0 CT1 TAPERPNT 27 (SUTURE) ×4 IMPLANT
SUTURE STRATFX 0 PDS 27 VIOLET (SUTURE) ×2 IMPLANT
TRAY FOLEY MTR SLVR 16FR STAT (SET/KITS/TRAYS/PACK) ×1 IMPLANT
TUBE SUCTION HIGH CAP CLEAR NV (SUCTIONS) ×3 IMPLANT
WATER STERILE IRR 1000ML POUR (IV SOLUTION) ×3 IMPLANT

## 2022-03-13 NOTE — Discharge Instructions (Signed)

## 2022-03-13 NOTE — H&P (Signed)
TOTAL HIP ADMISSION H&P ? ?Patient is admitted for left total hip arthroplasty. ? ?Subjective: ? ?Chief Complaint: left hip pain ? ?HPI: Nicholas Rowland, 76 y.o. male, has a history of pain and functional disability in the left hip(s) due to arthritis and patient has failed non-surgical conservative treatments for greater than 12 weeks to include NSAID's and/or analgesics and activity modification.  Onset of symptoms was gradual starting 2 years ago with gradually worsening course since that time.The patient noted no past surgery on the left hip(s).  Patient currently rates pain in the left hip at 8 out of 10 with activity. Patient has worsening of pain with activity and weight bearing, pain that interfers with activities of daily living, and pain with passive range of motion. Patient has evidence of joint space narrowing by imaging studies. This condition presents safety issues increasing the risk of falls There is no current active infection. ? ?Patient Active Problem List  ? Diagnosis Date Noted  ? Lumbar radiculopathy 01/12/2022  ? 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 Atypical chest pain 08/30/2017  ? Dyslipidemia 08/30/2017  ? Barrett esophagus 08/29/2017  ? Elevated cholesterol 08/29/2017  ? Pernicious anemia 08/29/2017  ? Chronic pain of both knees 08/15/2017  ? ?Past Medical History:  ?Diagnosis Date  ? Barrett esophagus   ? GERD (gastroesophageal reflux disease)   ? History of bronchitis   ? 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  ?Past Surgical History:  ?Procedure Laterality Date  ? BACK SURGERY  2003  ? L4-L5  ? COLONOSCOPY    ? ESOPHAGOGASTRODUODENOSCOPY    ? TOTAL KNEE ARTHROPLASTY Left 12/09/2017  ? Procedure: TOTAL KNEE ARTHROPLASTY;  Surgeon: Dannielle Huh, MD;  Location: [facility] OR;  Service: Orthopedics;  Laterality: Left;  ?  ?No current facility-administered medications for this encounter.  ? ?Current Outpatient Medications  ?Medication Sig Dispense Refill Last Dose  ? pantoprazole (PROTONIX)  40 MG tablet Take 40 mg by mouth daily.     ? ?No Known Allergies  ?Social History  ? ?Tobacco Use  ? Smoking status: Former  ?  Packs/day: 1.50  ?  Years: 30.00  ?  Pack years: 45.00  ?  Types: Cigarettes  ?  Quit date: 2003  ?  Years since quitting: 20.3  ? Smokeless tobacco: Never  ? Tobacco comments:  ?  quit 15 years ago  ?Substance Use Topics  ? Alcohol use: No  ?  ?Family History  ?Problem Relation Age of Onset  ? Breast cancer Mother   ? Barrett's esophagus Brother   ? Esophageal cancer Brother   ?  ? ?Review of Systems  ?Constitutional:  Negative for chills and fever.  ?Respiratory:  Negative for cough and shortness of breath.   ?Cardiovascular:  Negative for chest pain.  ?Gastrointestinal:  Negative for nausea and vomiting.  ?Musculoskeletal:  Positive for arthralgias.  ? ? ?Objective: ? ?Physical Exam ?Well nourished and well developed. ?General: Alert and oriented x3, cooperative and pleasant, no acute distress. ?Head: normocephalic, atraumatic, neck supple. ?Eyes: EOMI. ? ?Musculoskeletal: ?Left hip exam: ?Painful and limited hip flexion internal rotation over 5 degrees with external rotation to 20 degrees with pain reproduced over the anterior aspect of the hip ?Left knee exam reveals well-healed surgical incision with full knee extension and flexion of 110 degrees with stable ligaments ?No lower extremity edema or erythema ? ?Calves soft and nontender. Motor function intact in LE. Strength 5/5 LE bilaterally. ?Neuro: Distal pulses 2+.  Sensation to light touch intact in LE. ? ?Vital signs in last 24 hours: ?  ? ?Labs: ? ? ?Estimated body mass index is 36.64 kg/m? as calculated from the following: ?  Height as of 03/02/22: 6\' 4"  (1.93 m). ?  Weight as of 03/02/22: 136.5 kg. ? ? ?Imaging Review ?Plain radiographs demonstrate severe degenerative joint disease of the left hip(s). The bone quality appears to be adequate for age and reported activity level. ? ? ? ? ? ?Assessment/Plan: ? ?End stage arthritis,  left hip(s) ? ?The patient history, physical examination, clinical judgement of the provider and imaging studies are consistent with end stage degenerative joint disease of the left hip(s) and total hip arthroplasty is deemed medically necessary. The treatment options including medical management, injection therapy, arthroscopy and arthroplasty were discussed at length. The risks and benefits of total hip arthroplasty were presented and reviewed. The risks due to aseptic loosening, infection, stiffness, dislocation/subluxation,  thromboembolic complications and other imponderables were discussed.  The patient acknowledged the explanation, agreed to proceed with the plan and consent was signed. Patient is being admitted for inpatient treatment for surgery, pain control, PT, OT, prophylactic antibiotics, VTE prophylaxis, progressive ambulation and ADL's and discharge planning.The patient is planning to be discharged  home. ? ?Therapy Plans: HEP ?Disposition: Home with wife ?Planned DVT Prophylaxis: aspirin 81mg  BID ?DME needed: none ?PCP: Dr. 03/04/22, will call ?TXA: IV ?Allergies: NKDA ?Anesthesia Concerns: none ?BMI: 37 ?Last HgbA1c: Not diabetic ? ? ?Other: ?- Hydrocodone (30), robaxin, tylenol, celebrex ? ? ? ? , PA-C ?Orthopedic Surgery ?[facility] ?[phone] ? ?

## 2022-03-13 NOTE — Care Plan (Signed)
Ortho Bundle Case Management Note ? ?Patient Details  ?Name: Nicholas Rowland ?MRN: [id number] ?Date of Birth: 06-08-1946 ? ?L THA on 03-13-22 ?DCP:  Home with wife ?DME:  No needs, has a RW ?PT:  HEP ?                ? ? ? ?DME Arranged:  N/A ?DME Agency:  NA ? ?HH Arranged:  NA ?HH Agency:  NA ? ?Additional Comments: ?Please contact me with any questions of if this plan should need to change. ? ?Collene Schlichter  [phone] ?03/13/2022, 9:46 AM ?  ?

## 2022-03-13 NOTE — Op Note (Signed)
? NAME:  Nicholas Rowland                ACCOUNT NO.: [id number]  ?   ? MEDICAL RECORD NO.: [id number]  ?   ? FACILITY:  [facility]  ?   ? PHYSICIAN:  Shelda Pal  DATE OF BIRTH:  [birth year] ?   ? DATE OF PROCEDURE:  03/13/2022 ?   ?                             OPERATIVE REPORT  ?   ?   ? PREOPERATIVE DIAGNOSIS: Left  hip osteoarthritis.  ?   ? POSTOPERATIVE DIAGNOSIS:  Left hip osteoarthritis.  ?   ? PROCEDURE:  Left total hip replacement through an anterior approach  ? utilizing DePuy THR system, component size 64 mm pinnacle cup, a size 36+4 neutral  ? Altrex liner, a size 11 Hi Actis stem with a 36+1.5 delta ceramic  ? ball.  ?   ? SURGEON:  Madlyn Frankel. Charlann Boxer, M.D.  ?   ? ASSISTANT:  Rosalene Billings, PA-C ?   ? ANESTHESIA:  Spinal.  ?   ? SPECIMENS:  None.  ?   ? COMPLICATIONS:  None.  ?   ? BLOOD LOSS:  700 cc ?   ? DRAINS:  None.  ?   ? INDICATION OF THE PROCEDURE:  Nicholas Rowland is a 76 y.o. male who had  ? presented to office for evaluation of left hip pain.  Radiographs revealed  ? progressive degenerative changes with bone-on-bone  ? articulation of the  hip joint, including subchondral cystic changes and osteophytes.  The patient had painful limited range of  ? motion significantly affecting their overall quality of life and function.  The patient was failing to   ? respond to conservative measures including medications and/or injections and activity modification and at this point was ready  ? to proceed with more definitive measures.  Consent was obtained for  ? benefit of pain relief.  Specific risks of infection, DVT, component  ? failure, dislocation, neurovascular injury, and need for revision surgery were reviewed in the office as well discussion of  ? the anterior versus posterior approach were reviewed. ?   ? PROCEDURE IN DETAIL:  The patient was brought to operative theater.  ? Once adequate anesthesia, preoperative antibiotics, 2 gm of Ancef, 1 gm of Tranexamic Acid, and 10 mg  of Decadron were administered, the patient was positioned supine on the [employer] table.  Once the patient was safely positioned with adequate padding of boney prominences we predraped out the hip, and used fluoroscopy to confirm orientation of the pelvis.  ?   ? The left hip was then prepped and draped from proximal iliac crest to  ? mid thigh with a shower curtain technique.  ?   ? Time-out was performed identifying the patient, planned procedure, and the appropriate extremity.   ? ? An incision was then made 2 cm lateral to the  ? anterior superior iliac spine extending over the orientation of the  ? tensor fascia lata muscle and sharp dissection was carried down to the  ? fascia of the muscle.  ?   ? The fascia was then incised.  The muscle belly was identified and swept  ? laterally and retractor placed along the superior neck.  Following  ? cauterization of the circumflex vessels and removing some  pericapsular  ? fat, a second cobra retractor was placed on the inferior neck.  A T-capsulotomy was made along the line of the  ? superior neck to the trochanteric fossa, then extended proximally and  ? distally.  Tag sutures were placed and the retractors were then placed  ? intracapsular.  We then identified the trochanteric fossa and  ? orientation of my neck cut and then made a neck osteotomy with the femur on traction.  The femoral  ? head was removed without difficulty or complication.  Traction was let  ? off and retractors were placed posterior and anterior around the  ? acetabulum.  ?   ? The labrum and foveal tissue were debrided.  I began reaming with a 52 mm  ? reamer and reamed up to 63 mm reamer with good bony bed preparation and a 64 mm ? cup was chosen.  The final 64 mm Pinnacle cup was then impacted under fluoroscopy to confirm the depth of penetration and orientation with respect to  ? Abduction and forward flexion.  A screw was placed into the ilium followed by the hole eliminator.  The final  ?  36+4 neutral Altrex liner was impacted with good visualized rim fit.  The cup was positioned anatomically within the acetabular portion of the pelvis.  ?   ? At this point, the femur was rolled to 100 degrees.  Further capsule was  ? released off the inferior aspect of the femoral neck.  I then  ? released the superior capsule proximally.  With the leg in a neutral position the hook was placed laterally  ? along the femur under the vastus lateralis origin and elevated manually and then held in position using the hook attachment on the bed.  The leg was then extended and adducted with the leg rolled to 100  ? degrees of external rotation.  Retractors were placed along the medial calcar and posteriorly over the greater trochanter.  Once the proximal femur was fully  ? exposed, I used a box osteotome to set orientation.  I then began  ? broaching with the starting chili pepper broach and passed this by hand and then broached up to 11.  With the 11 broach in place I chose a high offset neck and did several trial reductions.  The offset was appropriate, leg lengths  ? appeared to be equal best matched with the +1.5 head ball trial confirmed radiographically.  ? Given these findings, I went ahead and dislocated the hip, repositioned all  ? retractors and positioned the right hip in the extended and abducted position.  ?The final 11 Hi Actis stem was  ? chosen and it was impacted down to the level of neck cut.  Based on this  ? and the trial reductions, a final 36+1.5 delta ceramic ball was chosen and  ? impacted onto a clean and dry trunnion, and the hip was reduced.  The  ? hip had been irrigated throughout the case again at this point.  I did  ? reapproximate the superior capsular leaflet to the anterior leaflet  ? using #1 Vicryl.  The fascia of the  ? tensor fascia lata muscle was then reapproximated using #1 Vicryl and #0 Stratafix sutures.  The  ? remaining wound was closed with 2-0 Vicryl and running 4-0 Monocryl.   ? The hip was cleaned, dried, and dressed sterilely using Dermabond and  ? Aquacel dressing.  The patient was then brought  ? to  recovery room in stable condition tolerating the procedure well.  ? ? Rosalene Billingsshley Donovan, PA-C was present for the entirety of the case involved from  ? preoperative positioning, perioperative retractor management, general  ? facilitation of the case, as well as primary wound closure as assistant.  ?   ?   ?   ? Madlyn FrankelMatthew D. Charlann Boxerlin, M.D.  ?   ?   ?03/13/2022 10:08 AM  ?   ?   ?

## 2022-03-13 NOTE — Plan of Care (Signed)
  Problem: Education: Goal: Knowledge of the prescribed therapeutic regimen will improve Outcome: Progressing   Problem: Activity: Goal: Ability to tolerate increased activity will improve Outcome: Progressing   Problem: Clinical Measurements: Goal: Postoperative complications will be avoided or minimized Outcome: Progressing   Problem: Pain Management: Goal: Pain level will decrease with appropriate interventions Outcome: Progressing   

## 2022-03-13 NOTE — Anesthesia Procedure Notes (Signed)
Spinal ? ?Patient location during procedure: OR ?Start time: 03/13/2022 11:38 AM ?End time: 03/13/2022 11:43 AM ?Reason for block: surgical anesthesia ?Staffing ?Performed: anesthesiologist  ?Anesthesiologist: Lynda Rainwater, MD ?Preanesthetic Checklist ?Completed: patient identified, IV checked, site marked, risks and benefits discussed, surgical consent, monitors and equipment checked, pre-op evaluation and timeout performed ?Spinal Block ?Patient position: sitting ?Prep: DuraPrep ?Patient monitoring: heart rate, cardiac monitor, continuous pulse ox and blood pressure ?Approach: midline ?Location: L3-4 ?Injection technique: single-shot ?Needle ?Needle type: Sprotte and Quincke  ?Needle gauge: 22 G ?Needle length: 9 cm ?Assessment ?Sensory level: T4 ?Events: CSF return and second provider ? ? ? ?

## 2022-03-13 NOTE — Anesthesia Preprocedure Evaluation (Signed)
Anesthesia Evaluation  ?Patient identified by MRN, date of birth, ID band ?Patient awake ? ? ? ?Reviewed: ?Allergy & Precautions, NPO status , Patient's Chart, lab work & pertinent test results ? ?Airway ?Mallampati: I ? ?TM Distance: >3 FB ?Neck ROM: Full ? ? ? Dental ?no notable dental hx. ? ?  ?Pulmonary ?former smoker,  ?  ?Pulmonary exam normal ?breath sounds clear to auscultation ? ? ? ? ? ? Cardiovascular ?Normal cardiovascular exam ?Rhythm:Regular Rate:Normal ? ? ?  ?Neuro/Psych ?  ? GI/Hepatic ?GERD  Medicated and Controlled,  ?Endo/Other  ? ? Renal/GU ?  ? ?  ?Musculoskeletal ? ?(+) Arthritis , Osteoarthritis,   ? Abdominal ?  ?Peds ? Hematology ?  ?Anesthesia Other Findings ? ? Reproductive/Obstetrics ? ?  ? ? ? ? ? ? ? ? ? ? ? ? ? ?  ?  ? ? ? ? ? ? ? ? ?Anesthesia Physical ? ?Anesthesia Plan ? ?ASA: II ? ?Anesthesia Plan: Spinal  ? ?Post-op Pain Management:  Regional for Post-op pain and Minimal or no pain anticipated  ? ?Induction: Intravenous ? ?PONV Risk Score and Plan: 1 and Ondansetron and Treatment may vary due to age or medical condition ? ?Airway Management Planned: Simple Face Mask ? ?Additional Equipment:  ? ?Intra-op Plan:  ? ?Post-operative Plan:  ? ?Informed Consent: I have reviewed the patients History and Physical, chart, labs and discussed the procedure including the risks, benefits and alternatives for the proposed anesthesia with the patient or authorized representative who has indicated his/her understanding and acceptance.  ? ? ? ? ? ?Plan Discussed with: CRNA and Surgeon ? ?Anesthesia Plan Comments:   ? ? ? ? ? ? ?Anesthesia Quick Evaluation ? ?

## 2022-03-13 NOTE — Interval H&P Note (Signed)
History and Physical Interval Note: ? ?03/13/2022 ?10:07 AM ? ?Nicholas Rowland  has presented today for surgery, with the diagnosis of Left hip osteoarthritis.  The various methods of treatment have been discussed with the patient and family. After consideration of risks, benefits and other options for treatment, the patient has consented to  Procedure(s): ?TOTAL HIP ARTHROPLASTY ANTERIOR APPROACH (Left) as a surgical intervention.  The patient's history has been reviewed, patient examined, no change in status, stable for surgery.  I have reviewed the patient's chart and labs.  Questions were answered to the patient's satisfaction.   ? ? ?Mauri Pole ? ? ?

## 2022-03-13 NOTE — Transfer of Care (Signed)
Immediate Anesthesia Transfer of Care Note ? ?Patient: Nicholas Rowland ? ?Procedure(s) Performed: TOTAL HIP ARTHROPLASTY ANTERIOR APPROACH (Left: Hip) ? ?Patient Location: PACU ? ?Anesthesia Type:Spinal ? ?Level of Consciousness: awake and patient cooperative ? ?Airway & Oxygen Therapy: Patient Spontanous Breathing and Patient connected to face mask ? ?Post-op Assessment: Report given to RN and Post -op Vital signs reviewed and stable ? ?Post vital signs: Reviewed and stable ? ?Last Vitals:  ?Vitals Value Taken Time  ?BP 101/62 03/13/22 1333  ?Temp    ?Pulse 62 03/13/22 1337  ?Resp 12 03/13/22 1337  ?SpO2 100 % 03/13/22 1337  ?Vitals shown include unvalidated device data. ? ?Last Pain:  ?Vitals:  ? 03/13/22 0943  ?TempSrc:   ?PainSc: 6   ?   ? ?Patients Stated Pain Goal: 5 (03/13/22 4401) ? ?Complications: No notable events documented. ?

## 2022-03-13 NOTE — Evaluation (Signed)
Physical Therapy Evaluation ?Patient Details ?Name: Nicholas Rowland ?MRN: [id number] ?DOB: 03/20/1946 ?Today's Date: 03/13/2022 ? ?History of Present Illness ? Pt is a 76yo male presenting s/p L-THA, anterior approach on 03/13/22. PMH: GERD, L-TKA 2019, L4-L5 back surgery 2003.  ?Clinical Impression ? Nicholas Rowland is a 76 y.o. male POD 0 s/p L-THA, AA. Patient reports independence with mobility at baseline. Patient is now limited by functional impairments (see PT problem list below) and requires min guard for transfers and gait with RW. Patient was able to ambulate 30 feet with RW and min guard assist. Patient instructed in exercise to facilitate ROM and circulation to manage edema. Patient will benefit from continued skilled PT interventions to address impairments and progress towards PLOF. Acute PT will follow to progress mobility and stair training in preparation for safe discharge home.   ?   ? ?Recommendations for follow up therapy are one component of a multi-disciplinary discharge planning process, led by the attending physician.  Recommendations may be updated based on patient status, additional functional criteria and insurance authorization. ? ?Follow Up Recommendations Follow physician's recommendations for discharge plan and follow up therapies ? ?  ?Assistance Recommended at Discharge PRN  ?Patient can return home with the following ? A little help with walking and/or transfers;A little help with bathing/dressing/bathroom;Assistance with cooking/housework ? ?  ?Equipment Recommendations None recommended by PT  ?Recommendations for Other Services ?    ?  ?Functional Status Assessment Patient has had a recent decline in their functional status and demonstrates the ability to make significant improvements in function in a reasonable and predictable amount of time.  ? ?  ?Precautions / Restrictions Precautions ?Precautions: Fall ?Restrictions ?Weight Bearing Restrictions: Yes ?LLE Weight Bearing: Weight  bearing as tolerated  ? ?  ? ?Mobility ? Bed Mobility ?Overal bed mobility: Needs Assistance ?Bed Mobility: Supine to Sit ?  ?  ?Supine to sit: Supervision ?  ?  ?General bed mobility comments: Pt supervision, no physical assist required. ?  ? ?Transfers ?Overall transfer level: Needs assistance ?Equipment used: Rolling walker (2 wheels) ?Transfers: Sit to/from Stand ?Sit to Stand: Min guard ?  ?  ?  ?  ?  ?General transfer comment: Pt min guard for safety, no physical assist required. VCs for hand placement and powering up through BUE and RLE. ?  ? ?Ambulation/Gait ?Ambulation/Gait assistance: Min guard, +2 safety/equipment ?Gait Distance (Feet): 40 Feet ?Assistive device: Rolling walker (2 wheels) ?Gait Pattern/deviations: Step-to pattern ?Gait velocity: decreased ?  ?  ?General Gait Details: Pt ambulated with RW and min guard assist with recliner follow for safety, no physical assist required or overt LOB noted. VCs for proximity to device and maintaining step-to pattern for safety. ? ?Stairs ?  ?  ?  ?  ?  ? ?Wheelchair Mobility ?  ? ?Modified Rankin (Stroke Patients Only) ?  ? ?  ? ?Balance Overall balance assessment: Needs assistance ?Sitting-balance support: Feet supported, No upper extremity supported ?Sitting balance-Leahy Scale: Fair ?  ?  ?Standing balance support: Bilateral upper extremity supported, During functional activity, Reliant on assistive device for balance ?Standing balance-Leahy Scale: Poor ?  ?  ?  ?  ?  ?  ?  ?  ?  ?  ?  ?  ?   ? ? ? ?Pertinent Vitals/Pain Pain Assessment ?Pain Assessment: 0-10 ?Pain Score: 9  ?Pain Location: left thigh and down the leg ?Pain Descriptors / Indicators: Operative site guarding ?Pain Intervention(s): RN gave pain meds  during session, Limited activity within patient's tolerance, Monitored during session, Repositioned  ? ? ?Home Living Family/patient expects to be discharged to:: Private residence ?Living Arrangements: Spouse/significant other ?Available Help at  Discharge: Family;Available 24 hours/day ?Type of Home: House ?Home Access: Stairs to enter ?Entrance Stairs-Rails: Right;Left;Can reach both ?Entrance Stairs-Number of Steps: 5 ?  ?Home Layout: One level ?Home Equipment: Conservation officer, nature (2 wheels);Toilet riser;BSC/3in1;Shower seat ?   ?  ?Prior Function Prior Level of Function : Independent/Modified Independent ?  ?  ?  ?  ?  ?  ?Mobility Comments: IND ?ADLs Comments: Wife helps don/doff L sock and shoe, otherwise IND ?  ? ? ?Hand Dominance  ? Dominant Hand: Right ? ?  ?Extremity/Trunk Assessment  ? Upper Extremity Assessment ?Upper Extremity Assessment: Overall WFL for tasks assessed ?  ? ?Lower Extremity Assessment ?Lower Extremity Assessment: RLE deficits/detail;LLE deficits/detail ?RLE Deficits / Details: MMT ank pf/df 5/5 ?RLE Sensation: WNL ?LLE Deficits / Details: MMT ank pf/df 5/5 ?LLE Sensation: WNL ?  ? ?Cervical / Trunk Assessment ?Cervical / Trunk Assessment: Back Surgery  ?Communication  ? Communication: No difficulties  ?Cognition Arousal/Alertness: Awake/alert ?Behavior During Therapy: [facility] for tasks assessed/performed ?Overall Cognitive Status: Within Functional Limits for tasks assessed ?  ?  ?  ?  ?  ?  ?  ?  ?  ?  ?  ?  ?  ?  ?  ?  ?  ?  ?  ? ?  ?General Comments   ? ?  ?Exercises Total Joint Exercises ?Ankle Circles/Pumps: AROM, 20 reps, Both  ? ?Assessment/Plan  ?  ?PT Assessment Patient needs continued PT services  ?PT Problem List Decreased strength;Decreased range of motion;Decreased activity tolerance;Decreased balance;Decreased mobility;Decreased coordination;Decreased knowledge of use of DME;Pain ? ?   ?  ?PT Treatment Interventions DME instruction;Gait training;Stair training;Functional mobility training;Therapeutic activities;Therapeutic exercise;Balance training;Neuromuscular re-education;Patient/family education   ? ?PT Goals (Current goals can be found in the Care Plan section)  ?Acute Rehab PT Goals ?Patient Stated Goal: "to be able  to put on shoes and socks myself" ?PT Goal Formulation: With patient ?Time For Goal Achievement: 03/20/22 ?Potential to Achieve Goals: Good ? ?  ?Frequency 7X/week ?  ? ? ?Co-evaluation   ?  ?  ?  ?  ? ? ?  ?AM-PAC PT "6 Clicks" Mobility  ?Outcome Measure Help needed turning from your back to your side while in a flat bed without using bedrails?: None ?Help needed moving from lying on your back to sitting on the side of a flat bed without using bedrails?: A Little ?Help needed moving to and from a bed to a chair (including a wheelchair)?: A Little ?Help needed standing up from a chair using your arms (e.g., wheelchair or bedside chair)?: A Little ?Help needed to walk in hospital room?: A Little ?Help needed climbing 3-5 steps with a railing? : A Little ?6 Click Score: 19 ? ?  ?End of Session Equipment Utilized During Treatment: Gait belt ?Activity Tolerance: Patient tolerated treatment well ?Patient left: in chair;with call bell/phone within reach;with chair alarm set;with family/visitor present ?Nurse Communication: Mobility status ?PT Visit Diagnosis: Difficulty in walking, not elsewhere classified (R26.2) ?  ? ?Time: [id number] ?PT Time Calculation (min) (ACUTE ONLY): 26 min ? ? ?Charges:   PT Evaluation ?$PT Eval Low Complexity: 1 Low ?PT Treatments ?$Gait Training: 8-22 mins ?  ?   ? ? ?Coolidge Breeze, PT, DPT ?[facility] ?Office: [phone] ?Pager: [phone] ? ?Coolidge Breeze ?03/13/2022, 7:15  PM ? ?

## 2022-03-13 NOTE — Anesthesia Postprocedure Evaluation (Signed)
Anesthesia Post Note ? ?Patient: Nicholas Rowland ? ?Procedure(s) Performed: TOTAL HIP ARTHROPLASTY ANTERIOR APPROACH (Left: Hip) ? ?  ? ?Patient location during evaluation: PACU ?Anesthesia Type: Spinal ?Level of consciousness: awake and alert ?Pain management: pain level controlled ?Vital Signs Assessment: post-procedure vital signs reviewed and stable ?Respiratory status: spontaneous breathing, nonlabored ventilation and respiratory function stable ?Cardiovascular status: blood pressure returned to baseline and stable ?Postop Assessment: no apparent nausea or vomiting ?Anesthetic complications: no ? ? ?No notable events documented. ? ?Last Vitals:  ?Vitals:  ? 03/13/22 1500 03/13/22 1515  ?BP: 111/74 125/77  ?Pulse: 62 (!) 59  ?Resp: 14 14  ?Temp:    ?SpO2: 94% 92%  ?  ?Last Pain:  ?Vitals:  ? 03/13/22 1515  ?TempSrc:   ?PainSc: 0-No pain  ? ? ?  ?  ?  ?  ?  ?  ? ?Lowella Curb ? ? ? ? ?

## 2022-03-14 ENCOUNTER — Encounter (HOSPITAL_COMMUNITY): Payer: Self-pay | Admitting: Orthopedic Surgery

## 2022-03-14 DIAGNOSIS — M1612 Unilateral primary osteoarthritis, left hip: Secondary | ICD-10-CM | POA: Diagnosis not present

## 2022-03-14 LAB — CBC
HCT: 38.5 % — ABNORMAL LOW (ref 39.0–52.0)
Hemoglobin: 12.7 g/dL — ABNORMAL LOW (ref 13.0–17.0)
MCH: 31.7 pg (ref 26.0–34.0)
MCHC: 33 g/dL (ref 30.0–36.0)
MCV: 96 fL (ref 80.0–100.0)
Platelets: 200 10*3/uL (ref 150–400)
RBC: 4.01 MIL/uL — ABNORMAL LOW (ref 4.22–5.81)
RDW: 12.9 % (ref 11.5–15.5)
WBC: 15.6 10*3/uL — ABNORMAL HIGH (ref 4.0–10.5)
nRBC: 0 % (ref 0.0–0.2)

## 2022-03-14 LAB — BASIC METABOLIC PANEL
Anion gap: 8 (ref 5–15)
BUN: 16 mg/dL (ref 8–23)
CO2: 23 mmol/L (ref 22–32)
Calcium: 8.4 mg/dL — ABNORMAL LOW (ref 8.9–10.3)
Chloride: 104 mmol/L (ref 98–111)
Creatinine, Ser: 0.65 mg/dL (ref 0.61–1.24)
GFR, Estimated: >60 mL/min (ref >60)
Glucose, Bld: 186 mg/dL — ABNORMAL HIGH (ref 70–99)
Potassium: 4.1 mmol/L (ref 3.5–5.1)
Sodium: 135 mmol/L (ref 135–145)

## 2022-03-14 MED ORDER — ASPIRIN 81 MG PO CHEW: 81.0000 mg | CHEWABLE_TABLET | Freq: Two times a day (BID) | ORAL | 0 refills | Status: AC

## 2022-03-14 MED ORDER — DOCUSATE SODIUM 100 MG PO CAPS
100.0000 mg | ORAL_CAPSULE | Freq: Two times a day (BID) | ORAL | 0 refills | Status: DC
Start: 2022-03-14 — End: 2022-06-19

## 2022-03-14 MED ORDER — CELECOXIB 200 MG PO CAPS
200.0000 mg | ORAL_CAPSULE | Freq: Two times a day (BID) | ORAL | 0 refills | Status: DC
Start: 2022-03-14 — End: 2022-06-19

## 2022-03-14 MED ORDER — METHOCARBAMOL 500 MG PO TABS: 500.0000 mg | ORAL_TABLET | Freq: Four times a day (QID) | ORAL | 0 refills | Status: DC | PRN

## 2022-03-14 MED ORDER — POLYETHYLENE GLYCOL 3350 17 G PO PACK: 17.0000 g | PACK | Freq: Every day | ORAL | 0 refills | Status: DC | PRN

## 2022-03-14 MED ORDER — OXYCODONE HCL 5 MG PO TABS: 5.0000 mg | ORAL_TABLET | ORAL | Status: DC | PRN

## 2022-03-14 MED ORDER — HYDROCODONE-ACETAMINOPHEN 5-325 MG PO TABS: 1.0000 | ORAL_TABLET | Freq: Four times a day (QID) | ORAL | 0 refills | Status: DC | PRN

## 2022-03-14 MED ORDER — HYDROMORPHONE HCL 1 MG/ML IJ SOLN
1.0000 mg | INTRAMUSCULAR | Status: DC | PRN
Start: 2022-03-14 — End: 2022-03-14
  Administered 2022-03-14: 1 mg via INTRAVENOUS
  Filled 2022-03-14: qty 1

## 2022-03-14 NOTE — Progress Notes (Signed)
? ?  Subjective: ?1 Day Post-Op Procedure(s) (LRB): ?TOTAL HIP ARTHROPLASTY ANTERIOR APPROACH (Left) ?Patient reports pain as mild.   ?Patient seen in rounds for Dr. Charlann Boxer. ?Patient is resting in bed this morning on exam. No acute events overnight. Ambulated 30 feet with PT yesterday.  ?We will start therapy today.  ? ?Objective: ?Vital signs in last 24 hours: ?Temp:  [97.4 ?F (36.3 ?C)-98.9 ?F (37.2 ?C)] 98.9 ?F (37.2 ?C) (04/26 0408) ?Pulse Rate:  [58-98] 98 (04/26 0408) ?Resp:  [12-20] 16 (04/26 0408) ?BP: (99-188)/(57-124) 122/78 (04/26 0408) ?SpO2:  [92 %-98 %] 98 % (04/26 0408) ?Weight:  [136.5 kg] 136.5 kg (04/25 0943) ? ?Intake/Output from previous day: ? ?Intake/Output Summary (Last 24 hours) at 03/14/2022 0804 ?Last data filed at 03/14/2022 0756 ?Gross per 24 hour  ?Intake 4037.24 ml  ?Output 3100 ml  ?Net 937.24 ml  ?  ? ?Intake/Output this shift: ?Total I/O ?In: 123.7 [I.V.:123.7] ?Out: -  ? ?Labs: ?Recent Labs  ?  03/14/22 ?0332  ?HGB 12.7*  ? ?Recent Labs  ?  03/14/22 ?0332  ?WBC 15.6*  ?RBC 4.01*  ?HCT 38.5*  ?PLT 200  ? ?Recent Labs  ?  03/14/22 ?0332  ?NA 135  ?K 4.1  ?CL 104  ?CO2 23  ?BUN 16  ?CREATININE 0.65  ?GLUCOSE 186*  ?CALCIUM 8.4*  ? ?No results for input(s): LABPT, INR in the last 72 hours. ? ?Exam: ?General - Patient is Alert and Oriented ?Extremity - Neurologically intact ?Neurovascular intact ?Sensation intact distally ?Intact pulses distally ?Dressing - dressing C/D/I ?Motor Function - intact, moving foot and toes well on exam.  ? ?Past Medical History:  ?Diagnosis Date  ? Barrett esophagus   ? GERD (gastroesophageal reflux disease)   ? History of bronchitis   ? 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 ? ?Assessment/Plan: ?1 Day Post-Op Procedure(s) (LRB): ?TOTAL HIP ARTHROPLASTY ANTERIOR APPROACH (Left) ?Principal Problem: ?  S/P total left hip arthroplasty ? ?Estimated body mass index is 36.64 kg/m? as calculated from the following: ?  Height as of this encounter: 6\' 4"  (1.93 m). ?   Weight as of this encounter: 136.5 kg. ?Advance diet ?Up with therapy ?D/C IV fluids ? ?DVT Prophylaxis - Aspirin ?Weight bearing as tolerated. ? ?Plan is to go Home after hospital stay. Plan for discharge today after meeting goals with therapy. Follow up in the office in 2 weeks.  ? ? , PA-C ?Orthopedic Surgery ?([phone]) Dennie Bible ?03/14/2022, 8:04 AM  ?

## 2022-03-14 NOTE — Progress Notes (Signed)
Physical Therapy Treatment ?Patient Details ?Name: Nicholas Rowland ?MRN: [id number] ?DOB: [birth year] ?Today's Date: 03/14/2022 ? ? ?History of Present Illness Pt is a 76yo male presenting s/p L-THA, anterior approach on 03/13/22. PMH: GERD, L-TKA 2019, L4-L5 back surgery 2003. ? ?  ?PT Comments  ? ? POD # 1 pm session ?Assisted with amb an increased distance in hallway, practiced stairs then completed remaining TE's following HEP handout.  Addressed all mobility questions, discussed appropriate activity, educated on use of ICE.  Pt ready for D/C to home. ?  ?Recommendations for follow up therapy are one component of a multi-disciplinary discharge planning process, led by the attending physician.  Recommendations may be updated based on patient status, additional functional criteria and insurance authorization. ? ?Follow Up Recommendations ? Follow physician's recommendations for discharge plan and follow up therapies ?  ?  ?Assistance Recommended at Discharge    ?Patient can return home with the following A little help with walking and/or transfers;A little help with bathing/dressing/bathroom;Assistance with cooking/housework ?  ?Equipment Recommendations ? None recommended by PT  ?  ?Recommendations for Other Services   ? ? ?  ?Precautions / Restrictions Precautions ?Precautions: Fall ?Precaution Comments: falls ?Restrictions ?Weight Bearing Restrictions: No ?LLE Weight Bearing: Weight bearing as tolerated  ?  ? ?Mobility ? Bed Mobility ?  ?General bed mobility comments: OOB in recliner ?  ? ?Transfers ?Overall transfer level: Needs assistance ?Equipment used: Rolling walker (2 wheels) ?Transfers: Sit to/from Stand ?Sit to Stand: Supervision, Min guard ?  ?  ?  ?  ?  ?General transfer comment: 50% VC's on proper hand placement and safety with turns ?  ? ?Ambulation/Gait ?Ambulation/Gait assistance: Supervision, Min guard ?Gait Distance (Feet): 57 Feet ?Assistive device: Rolling walker (2 wheels) ?Gait  Pattern/deviations: Step-to pattern, Decreased stance time - left ?Gait velocity: decreased ?  ?  ?General Gait Details: initial gait instability but improved with distance.  50% VC's on proper sequencing, proper walker to self distance and safety with turns. ? ? ?Stairs ?Stairs: Yes ?Stairs assistance: Supervision, Min guard ?Stair Management: Two rails, Step to pattern, Forwards ?Number of Stairs: 4 ?General stair comments: with Spouse "hand on" assisted pt navigating stairs using B rails at 25% VC's on proper sequencing and safety.  Pt did well. ? ? ?Wheelchair Mobility ?  ? ?Modified Rankin (Stroke Patients Only) ?  ? ? ?  ?Balance   ?  ?  ?  ?  ?  ?  ?  ?  ?  ?  ?  ?  ?  ?  ?  ?  ?  ?  ?  ? ?  ?Cognition Arousal/Alertness: Awake/alert ?Behavior During Therapy: [facility] for tasks assessed/performed ?Overall Cognitive Status: Within Functional Limits for tasks assessed ?  ?  ?  ?  ?  ?  ?  ?  ?  ?  ?  ?  ?  ?  ?  ?  ?General Comments: AxO x 3 very pleasant ?  ?  ? ?  ?Exercises  05 reps all standing TE's ? ?  ?General Comments   ?  ?  ? ?Pertinent Vitals/Pain Pain Assessment ?Pain Assessment: Faces ?Pain Score: 5  ?Pain Location: left thigh and down the leg to knee ?Pain Descriptors / Indicators: Operative site guarding, Constant, Tender, Tightness ?Pain Intervention(s): Monitored during session, Premedicated before session, Repositioned, Ice applied  ? ? ?Home Living   ?  ?  ?  ?  ?  ?  ?  ?  ?  ?   ?  ?  Prior Function    ?  ?  ?   ? ?PT Goals (current goals can now be found in the care plan section) Progress towards PT goals: Progressing toward goals ? ?  ?Frequency ? ? ? 7X/week ? ? ? ?  ?PT Plan Current plan remains appropriate  ? ? ?Co-evaluation   ?  ?  ?  ?  ? ?  ?AM-PAC PT "6 Clicks" Mobility   ?Outcome Measure ? Help needed turning from your back to your side while in a flat bed without using bedrails?: A Little ?Help needed moving from lying on your back to sitting on the side of a flat bed without using  bedrails?: A Little ?Help needed moving to and from a bed to a chair (including a wheelchair)?: A Little ?Help needed standing up from a chair using your arms (e.g., wheelchair or bedside chair)?: A Little ?Help needed to walk in hospital room?: A Little ?Help needed climbing 3-5 steps with a railing? : A Little ?6 Click Score: 18 ? ?  ?End of Session Equipment Utilized During Treatment: Gait belt ?Activity Tolerance: Patient tolerated treatment well ?Patient left: in chair;with call bell/phone within reach;with chair alarm set;with family/visitor present ?Nurse Communication: Mobility status ?PT Visit Diagnosis: Difficulty in walking, not elsewhere classified (R26.2) ?  ? ? ?Time: 1325-1350 ?PT Time Calculation (min) (ACUTE ONLY): 25 min ? ?Charges:  $Gait Training: 8-22 mins ?$Therapeutic Activity: 8-22 mins          ?          ? ? ? ?Felecia Shelling  PTA ?Acute  Rehabilitation Services ?Pager      [phone] ?Office      [phone] ? ?

## 2022-03-14 NOTE — TOC Transition Note (Signed)
Transition of Care (TOC) - CM/SW Discharge Note ? ?Patient Details  ?Name: SWADE SHONKA ?MRN: [id number] ?Date of Birth: June 10, 1946 ? ?Transition of Care (TOC) CM/SW Contact:  ?Sherie Don, LCSW ?Phone Number: ?03/14/2022, 10:40 AM ? ?Clinical Narrative: Patient is expected to discharge home after working with PT. CSW met with patient to confirm discharge plan. Patient will discharge home with a home exercise program (HEP). Patient has a rolling walker and 3N1 at home, so there are no DME needs at this time. TOC signing off. ? ?Final next level of care: Home/Self Care ?Barriers to Discharge: No Barriers Identified ? ?Patient Goals and CMS Choice ?Patient states their goals for this hospitalization and ongoing recovery are:: Discharge home with HEP ?Choice offered to / list presented to : NA ? ?Discharge Plan and Services        ?DME Arranged: N/A ?DME Agency: NA ?HH Arranged: NA ?[place] Agency: NA ? ?Readmission Risk Interventions ?   ? View : No data to display.  ?  ?  ?  ? ?

## 2022-03-14 NOTE — Progress Notes (Signed)
Physical Therapy Treatment ?Patient Details ?Name: Nicholas Rowland ?MRN: [id number] ?DOB: 26-Jun-1946 ?Today's Date: 03/14/2022 ? ? ?History of Present Illness Pt is a 76yo male presenting s/p L-THA, anterior approach on 03/13/22. PMH: GERD, L-TKA 2019, L4-L5 back surgery 2003. ? ?  ?PT Comments  ? ? POD # 1 am session ?Assisted OOB to amb in hallway required increased time.  General bed mobility comments: instructed on how to use a belt to self assist LE with increased time.  General transfer comment: 50% VC's on proper hand placement and safety with turns.  General Gait Details: initial gait instability but improved with distance.  50% VC's on proper sequencing, proper walker to self distance and safety with turns. Then returned to room to perform some TE's following HEP handout.  Instructed on proper tech, freq as well as use of ICE.   ?Pt will need another session to complete HEP and practice stairs.   ?Recommendations for follow up therapy are one component of a multi-disciplinary discharge planning process, led by the attending physician.  Recommendations may be updated based on patient status, additional functional criteria and insurance authorization. ? ?Follow Up Recommendations ? Follow physician's recommendations for discharge plan and follow up therapies ?  ?  ?Assistance Recommended at Discharge    ?Patient can return home with the following A little help with walking and/or transfers;A little help with bathing/dressing/bathroom;Assistance with cooking/housework ?  ?Equipment Recommendations ? None recommended by PT  ?  ?Recommendations for Other Services   ? ? ?  ?Precautions / Restrictions Precautions ?Precautions: Fall ?Precaution Comments: falls ?Restrictions ?Weight Bearing Restrictions: No ?LLE Weight Bearing: Weight bearing as tolerated  ?  ? ?Mobility ? Bed Mobility ?Overal bed mobility: Needs Assistance ?Bed Mobility: Supine to Sit ?  ?  ?Supine to sit: Supervision, Min guard ?  ?  ?General bed  mobility comments: instructed on how to use a belt to self assist LE with increased time ?  ? ?Transfers ?Overall transfer level: Needs assistance ?Equipment used: Rolling walker (2 wheels) ?Transfers: Sit to/from Stand ?Sit to Stand: Supervision, Min guard ?  ?  ?  ?  ?  ?General transfer comment: 50% VC's on proper hand placement and safety with turns ?  ? ?Ambulation/Gait ?Ambulation/Gait assistance: Supervision, Min guard ?Gait Distance (Feet): 45 Feet ?Assistive device: Rolling walker (2 wheels) ?Gait Pattern/deviations: Step-to pattern, Decreased stance time - left ?Gait velocity: decreased ?  ?  ?General Gait Details: initial gait instability but improved with distance.  50% VC's on proper sequencing, proper walker to self distance and safety with turns. ? ? ?Stairs ?  ?  ?  ?  ?  ? ? ?Wheelchair Mobility ?  ? ?Modified Rankin (Stroke Patients Only) ?  ? ? ?  ?Balance   ?  ?  ?  ?  ?  ?  ?  ?  ?  ?  ?  ?  ?  ?  ?  ?  ?  ?  ?  ? ?  ?Cognition Arousal/Alertness: Awake/alert ?Behavior During Therapy: [facility] for tasks assessed/performed ?Overall Cognitive Status: Within Functional Limits for tasks assessed ?  ?  ?  ?  ?  ?  ?  ?  ?  ?  ?  ?  ?  ?  ?  ?  ?General Comments: AxO x 3 very pleasant ?  ?  ? ?  ?Exercises  Total Hip Replacement TE's following HEP Handout ?10 reps ankle pumps ?05 reps knee  presses ?05 reps heel slides ?05 reps SAQ's ?05 reps ABD ?Instructed how to use a belt loop to assist  ?Followed by ICE ? ? ?  ?General Comments   ?  ?  ? ?Pertinent Vitals/Pain Pain Assessment ?Pain Assessment: Faces ?Pain Score: 5  ?Pain Location: left thigh and down the leg to knee ?Pain Descriptors / Indicators: Operative site guarding, Constant, Tender, Tightness ?Pain Intervention(s): Monitored during session, Premedicated before session, Repositioned, Ice applied  ? ? ?Home Living   ?  ?  ?  ?  ?  ?  ?  ?  ?  ?   ?  ?Prior Function    ?  ?  ?   ? ?PT Goals (current goals can now be found in the care plan section)  Progress towards PT goals: Progressing toward goals ? ?  ?Frequency ? ? ? 7X/week ? ? ? ?  ?PT Plan Current plan remains appropriate  ? ? ?Co-evaluation   ?  ?  ?  ?  ? ?  ?AM-PAC PT "6 Clicks" Mobility   ?Outcome Measure ? Help needed turning from your back to your side while in a flat bed without using bedrails?: A Little ?Help needed moving from lying on your back to sitting on the side of a flat bed without using bedrails?: A Little ?Help needed moving to and from a bed to a chair (including a wheelchair)?: A Little ?Help needed standing up from a chair using your arms (e.g., wheelchair or bedside chair)?: A Little ?Help needed to walk in hospital room?: A Little ?Help needed climbing 3-5 steps with a railing? : A Little ?6 Click Score: 18 ? ?  ?End of Session Equipment Utilized During Treatment: Gait belt ?Activity Tolerance: Patient tolerated treatment well ?Patient left: in chair;with call bell/phone within reach;with chair alarm set;with family/visitor present ?Nurse Communication: Mobility status ?PT Visit Diagnosis: Difficulty in walking, not elsewhere classified (R26.2) ?  ? ? ?Time: 0930-1005 ?PT Time Calculation (min) (ACUTE ONLY): 35 min ? ?Charges:  $Gait Training: 8-22 mins ?$Therapeutic Exercise: 8-22 mins          ?          ? ?Felecia Shelling  PTA ?Acute  Rehabilitation Services ?Pager      [phone] ?Office      [phone] ? ? ?

## 2022-03-22 NOTE — Discharge Summary (Signed)
Patient ID: ?Nicholas Rowland ?MRN: [id number] ?DOB/AGE: [birth year] 76 y.o. ? ?Admit date: 03/13/2022 ?Discharge date: 03/14/2022 ? ?Admission Diagnoses:  ?Left hip osteoarthritis ? ?Discharge Diagnoses:  ?Principal Problem: ?  S/P total left hip arthroplasty ? ? ?Past Medical History:  ?Diagnosis Date  ? Barrett esophagus   ? GERD (gastroesophageal reflux disease)   ? History of bronchitis   ? 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 ? ?Surgeries: Procedure(s): ?TOTAL HIP ARTHROPLASTY ANTERIOR APPROACH on 03/13/2022 ?  ?Consultants:  ? ?Discharged Condition: Improved ? ?Hospital Course: Nicholas Rowland is an 76 y.o. male who was admitted 03/13/2022 for operative treatment ofS/P total left hip arthroplasty. Patient has severe unremitting pain that affects sleep, daily activities, and work/hobbies. After pre-op clearance the patient was taken to the operating room on 03/13/2022 and underwent  Procedure(s): ?TOTAL HIP ARTHROPLASTY ANTERIOR APPROACH.   ? ?Patient was given perioperative antibiotics:  ?Anti-infectives (From admission, onward)  ? ? Start     Dose/Rate Route Frequency Ordered Stop  ? 03/13/22 1800  ceFAZolin (ANCEF) IVPB 2g/100 mL premix       ? 2 g ?200 mL/hr over 30 Minutes Intravenous Every 6 hours 03/13/22 1548 03/14/22 0007  ? 03/13/22 0930  ceFAZolin (ANCEF) IVPB 3g/100 mL premix       ? 3 g ?200 mL/hr over 30 Minutes Intravenous On call to O.R. 03/13/22 [id number] 03/13/22 1151  ? ?  ?  ? ?Patient was given sequential compression devices, early ambulation, and chemoprophylaxis to prevent DVT. Patient worked with PT and was meeting their goals regarding safe ambulation and transfers. ? ?Patient benefited maximally from hospital stay and there were no complications.   ? ?Recent vital signs: No data found.  ? ?Recent laboratory studies: No results for input(s): WBC, HGB, HCT, PLT, NA, K, CL, CO2, BUN, CREATININE, GLUCOSE, INR, CALCIUM in the last 72 hours. ? ?Invalid input(s): PT, 2 ? ? ?Discharge Medications:    ?Allergies as of 03/14/2022   ?No Known Allergies ?  ? ?  ?Medication List  ?  ? ?TAKE these medications   ? ?aspirin 81 MG chewable tablet ?Chew 1 tablet (81 mg total) by mouth 2 (two) times daily for 28 days. ?  ?celecoxib 200 MG capsule ?Commonly known as: CELEBREX ?Take 1 capsule (200 mg total) by mouth 2 (two) times daily. ?  ?docusate sodium 100 MG capsule ?Commonly known as: COLACE ?Take 1 capsule (100 mg total) by mouth 2 (two) times daily. ?  ?HYDROcodone-acetaminophen 5-325 MG tablet ?Commonly known as: NORCO/VICODIN ?Take 1-2 tablets by mouth every 6 (six) hours as needed for severe pain. ?  ?methocarbamol 500 MG tablet ?Commonly known as: ROBAXIN ?Take 1 tablet (500 mg total) by mouth every 6 (six) hours as needed for muscle spasms. ?  ?pantoprazole 40 MG tablet ?Commonly known as: PROTONIX ?Take 40 mg by mouth daily. ?  ?polyethylene glycol 17 g packet ?Commonly known as: MIRALAX / GLYCOLAX ?Take 17 g by mouth daily as needed for mild constipation. ?  ? ?  ? ?  ?  ? ? ?  ?Discharge Care Instructions  ?(From admission, onward)  ?  ? ? ?  ? ?  Start     Ordered  ? 03/14/22 0000  Change dressing       ?Comments: Maintain surgical dressing until follow up in the clinic. If the edges start to pull up, may reinforce with tape. If the dressing is no longer working, may remove  and cover with gauze and tape, but must keep the area dry and clean.  Call with any questions or concerns.  ? 03/14/22 2202  ? ?  ?  ? ?  ? ? ?Diagnostic Studies: DG Pelvis Portable ? ?Result Date: 03/13/2022 ?CLINICAL DATA:  Postoperative. Status post total left hip arthroplasty. EXAM: PORTABLE PELVIS 1-2 VIEWS COMPARISON:  None available FINDINGS: Single frontal view of the bilateral hips. Status post total left hip arthroplasty. No perihardware lucency is seen to indicate hardware failure or loosening. Mild superior left hip soft tissue air, normal for recent surgery. Mild right femoroacetabular joint space narrowing. No acute fracture  or dislocation. IMPRESSION: Status post total left hip arthroplasty without evidence of hardware failure on frontal view. Electronically Signed   By: Neita Garnet M.D.   On: 03/13/2022 14:41  ? ?DG C-Arm 1-60 Min-No Report ? ?Result Date: 03/13/2022 ?Fluoroscopy was utilized by the requesting physician.  No radiographic interpretation.  ? ?DG C-Arm 1-60 Min-No Report ? ?Result Date: 03/13/2022 ?Fluoroscopy was utilized by the requesting physician.  No radiographic interpretation.  ? ?DG HIP UNILAT WITH PELVIS 1V LEFT ? ?Result Date: 03/13/2022 ?CLINICAL DATA:  Left total hip arthroplasty. EXAM: DG HIP (WITH OR WITHOUT PELVIS) 1V*L* COMPARISON:  None. FINDINGS: C-arm fluoroscopy was provided in the operating room. 13 seconds of fluoroscopy time. 5.1 mGy. Spot AP images of the lower pelvis and left hip demonstrate the placement of a screw fixed left acetabular component. Femoral prosthesis is in place. The femoral head component has not yet been placed. No evidence of acute fracture or dislocation. There is gas in the soft tissues surrounding the hip. IMPRESSION: Intraoperative views during left total hip arthroplasty. No demonstrated complications. Electronically Signed   By: Carey Bullocks M.D.   On: 03/13/2022 13:18   ? ?Disposition: Discharge disposition: 01-Home or Self Care ? ? ? ? ? ? ?Discharge Instructions   ? ? Call MD / Call 911   Complete by: As directed ?  ? If you experience chest pain or shortness of breath, CALL 911 and be transported to the hospital emergency room.  If you develope a fever above 101 F, pus (white drainage) or increased drainage or redness at the wound, or calf pain, call your surgeon's office.  ? Change dressing   Complete by: As directed ?  ? Maintain surgical dressing until follow up in the clinic. If the edges start to pull up, may reinforce with tape. If the dressing is no longer working, may remove and cover with gauze and tape, but must keep the area dry and clean.  Call with any  questions or concerns.  ? Constipation Prevention   Complete by: As directed ?  ? Drink plenty of fluids.  Prune juice may be helpful.  You may use a stool softener, such as Colace (over the counter) 100 mg twice a day.  Use MiraLax (over the counter) for constipation as needed.  ? Diet - low sodium heart healthy   Complete by: As directed ?  ? Increase activity slowly as tolerated   Complete by: As directed ?  ? Weight bearing as tolerated with assist device (walker, cane, etc) as directed, use it as long as suggested by your surgeon or therapist, typically at least 4-6 weeks.  ? Post-operative opioid taper instructions:   Complete by: As directed ?  ? POST-OPERATIVE OPIOID TAPER INSTRUCTIONS: ?It is important to wean off of your opioid medication as soon as possible. If you do  not need pain medication after your surgery it is ok to stop day one. ?Opioids include: ?Codeine, Hydrocodone(Norco, Vicodin), Oxycodone(Percocet, oxycontin) and hydromorphone amongst others.  ?Long term and even short term use of opiods can cause: ?Increased pain response ?Dependence ?Constipation ?Depression ?Respiratory depression ?And more.  ?Withdrawal symptoms can include ?Flu like symptoms ?Nausea, vomiting ?And more ?Techniques to manage these symptoms ?Hydrate well ?Eat regular healthy meals ?Stay active ?Use relaxation techniques(deep breathing, meditating, yoga) ?Do Not substitute Alcohol to help with tapering ?If you have been on opioids for less than two weeks and do not have pain than it is ok to stop all together.  ?Plan to wean off of opioids ?This plan should start within one week post op of your joint replacement. ?Maintain the same interval or time between taking each dose and first decrease the dose.  ?Cut the total daily intake of opioids by one tablet each day ?Next start to increase the time between doses. ?The last dose that should be eliminated is the evening dose.  ? ?  ? TED hose   Complete by: As directed ?  ? Use  stockings (TED hose) for 2 weeks on both leg(s).  You may remove them at night for sleeping.  ? ?  ? ? ? Follow-up Information   ? ? Cassandria Anger, PA-C. Go on 03/28/2022.   ?Specialty: Orthopedic

## 2022-04-26 DIAGNOSIS — D510 Vitamin B12 deficiency anemia due to intrinsic factor deficiency: Secondary | ICD-10-CM | POA: Diagnosis not present

## 2022-04-27 DIAGNOSIS — Z96642 Presence of left artificial hip joint: Secondary | ICD-10-CM | POA: Diagnosis not present

## 2022-04-27 DIAGNOSIS — Z471 Aftercare following joint replacement surgery: Secondary | ICD-10-CM | POA: Diagnosis not present

## 2022-04-27 DIAGNOSIS — Z96652 Presence of left artificial knee joint: Secondary | ICD-10-CM | POA: Diagnosis not present

## 2022-05-10 ENCOUNTER — Encounter: Payer: Self-pay | Admitting: Podiatry

## 2022-05-10 ENCOUNTER — Ambulatory Visit: Payer: PPO | Admitting: Podiatry

## 2022-05-10 DIAGNOSIS — B353 Tinea pedis: Secondary | ICD-10-CM

## 2022-05-10 DIAGNOSIS — D510 Vitamin B12 deficiency anemia due to intrinsic factor deficiency: Secondary | ICD-10-CM | POA: Diagnosis not present

## 2022-05-10 DIAGNOSIS — G5793 Unspecified mononeuropathy of bilateral lower limbs: Secondary | ICD-10-CM

## 2022-05-10 DIAGNOSIS — Q828 Other specified congenital malformations of skin: Secondary | ICD-10-CM

## 2022-05-10 MED ORDER — FLUCONAZOLE 150 MG PO TABS: 150.0000 mg | ORAL_TABLET | ORAL | 0 refills | Status: AC

## 2022-05-10 MED ORDER — ATHLETES FOOT POWDER SPRAY 2 % EX AERP: INHALATION_SPRAY | CUTANEOUS | 3 refills | Status: DC

## 2022-05-10 NOTE — Patient Instructions (Signed)
Miconazole aerosol powder or liquid  What is this medication? MICONAZOLE (mi KON a zole) is an antifungal medicine. It is used to treat certain kinds of fungal or yeast infections of the skin. This medicine may be used for other purposes; ask your health care provider or pharmacist if you have questions. COMMON BRAND NAME(S): Cruex, Desenex, Desenex Jock Itch, Lotrimin AF, Lotrimin AF Antifungal Liquid, Lotrimin AF Deodorant, Lotrimin AF Powder, Lotrimin AF Spray, Micatin, Neosporin AF, Ting Antifungal What should I tell my care team before I take this medication? They need to know if you have any of these conditions: diabetes HIV or AIDS immune system problems other chronic health condition recent chemotherapy treatments an unusual or allergic reaction to miconazole, other medicines, foods, dyes or preservatives pregnant or trying to get pregnant breast-feeding How should I use this medication? This medicine is for external use only. Do not take by mouth. Follow the directions on the label. Wash hands before and after use. Cleanse and dry affected area thoroughly. Shake well. Spray on the affected area (or into socks and shoes). Do not breathe in the medicine. Do not use your medicine more often than directed. Use this medicine for the full amount of time recommended on the package or by your doctor or health care professional even if you begin to feel better. Do not use for more than 4 weeks without advice. Talk to your pediatrician regarding the use of this medicine in children. While this medicine may be prescribed for children as young as 2 years for selected conditions, precautions do apply. Overdosage: If you think you have taken too much of this medicine contact a poison control center or emergency room at once. NOTE: This medicine is only for you. Do not share this medicine with others. What if I miss a dose? If you miss a dose, use it as soon as you can. If it is almost time for your  next dose, use only that dose. Do not use double or extra doses. What may interact with this medication? Interactions are not expected. Do not use any other skin products on the affected area without asking your doctor or health care professional. This list may not describe all possible interactions. Give your health care provider a list of all the medicines, herbs, non-prescription drugs, or dietary supplements you use. Also tell them if you smoke, drink alcohol, or use illegal drugs. Some items may interact with your medicine. What should I watch for while using this medication? Tell your doctor or healthcare professional if your symptoms do not start to get better or if they get worse. You may have a skin infection that does not respond to this medicine. Do not spray near your eyes. If you do get any in your eyes, rinse out with plenty of cool tap water. If you are using this medicine for 'jock itch' be sure to dry the groin completely after bathing. Do not wear underwear that is tight-fitting or made from synthetic fibers like rayon or nylon. Wear loose-fitting, cotton underwear. If you are using this medicine for athlete's foot be sure to dry your feet carefully after bathing, especially between the toes. Do not wear socks made from wool or synthetic materials like rayon or nylon. Wear clean cotton socks and change them at least once a day, change them more if your feet sweat a lot. Also, try to wear sandals or shoes that are well-ventilated. What side effects may I notice from receiving this medication? Side  effects that you should report to your doctor or health care professional as soon as possible: allergic reactions like skin rash, itching or hives, swelling of the face, lips, or tongue increased inflammation, redness, or pain at the affected area Side effects that usually do not require medical attention (report to your doctor or health care professional if they continue or are  bothersome): mild skin irritation, burning, or itching at the affected area This list may not describe all possible side effects. Call your doctor for medical advice about side effects. You may report side effects to FDA at 1-800-FDA-1088. Where should I keep my medication? Keep out of the reach of children. Store at room temperature between 15 and 30 degrees C (59 and 86 degrees F). Throw away any unused medicine after the expiration date. Some products may contain alcohol or have other flammable components. Do not use while smoking or near heat or flame. NOTE: This sheet is a summary. It may not cover all possible information. If you have questions about this medicine, talk to your doctor, pharmacist, or health care provider.  2023 Elsevier/Gold Standard (2008-05-14 00:00:00)  Athlete's Foot Athlete's foot (tinea pedis) is a fungal infection of the skin on your feet. It often occurs on the skin that is between or underneath the toes. It can also occur on the soles of your feet. The infection can spread from person to person (is contagious). It can also spread when a person's bare feet come in contact with the fungus on shower floors or on items such as shoes. What are the causes? This condition is caused by a fungus that grows in warm, moist places. You can get athlete's foot by sharing shoes, shower stalls, towels, and wet floors with someone who is infected. Not washing your feet or changing your socks often enough can also lead to athlete's foot. What increases the risk? This condition is more likely to develop in: Men. People who have a weak body defense system (immune system). People who have diabetes. People who use public showers, such as at a gym. People who wear heavy-duty shoes, such as Youth worker. Seasons with warm, humid weather. What are the signs or symptoms? Symptoms of this condition include: Itchy areas between your toes or on the soles of your  feet. White, flaky, or scaly areas between your toes or on the soles of your feet. Very itchy small blisters between your toes or on the soles of your feet. Small cuts in your skin. These cuts can become infected. Thick or discolored toenails. How is this diagnosed? This condition may be diagnosed with a physical exam and a review of your medical history. Your health care provider may also take a skin or toenail sample to examine under a microscope. How is this treated? This condition is treated with antifungal medicines. These may be applied as powders, ointments, or creams. In severe cases, an oral antifungal medicine may be given. Follow these instructions at home: Medicines Apply or take over-the-counter and prescription medicines only as told by your health care provider. Apply your antifungal medicine as told by your health care provider. Do not stop using the antifungal even if your condition improves. Foot care Do not scratch your feet. Keep your feet dry: Wear cotton or wool socks. Change your socks every day or if they become wet. Wear shoes that allow air to flow, such as sandals or canvas tennis shoes. Wash and dry your feet, including the area between your toes.  Also, wash and dry your feet: Every day or as told by your health care provider. After exercising. General instructions Do not let others use towels, shoes, nail clippers, or other personal items that touch your feet. Protect your feet by wearing sandals in wet areas, such as locker rooms and shared showers. Keep all follow-up visits. This is important. If you have diabetes, keep your blood sugar under control. Contact a health care provider if: You have a fever. You have swelling, soreness, warmth, or redness in your foot. Your feet are not getting better with treatment. Your symptoms get worse. You have new symptoms. You have severe pain. Summary Athlete's foot (tinea pedis) is a fungal infection of the skin  on your feet. It often occurs on skin that is between or underneath the toes. This condition is caused by a fungus that grows in warm, moist places. Symptoms include white, flaky, or scaly areas between your toes or on the soles of your feet. This condition is treated with antifungal medicines. Keep your feet clean. Always dry them thoroughly. This information is not intended to replace advice given to you by your health care provider. Make sure you discuss any questions you have with your health care provider. Document Revised: 02/26/2021 Document Reviewed: 02/26/2021 Elsevier Patient Education  2023 [employer].

## 2022-05-20 NOTE — Progress Notes (Signed)
  Subjective:  Patient ID: Nicholas Rowland, male    DOB: March 06, 1946,  MRN: [id number]  Nicholas Rowland presents to clinic today for at risk foot care with history of peripheral neuropathy and painful porokeratotic lesion(s) right lower extremity and painful mycotic toenails that limit ambulation. Painful toenails interfere with ambulation. Aggravating factors include wearing enclosed shoe gear. Pain is relieved with periodic professional debridement. Painful porokeratotic lesions are aggravated when weightbearing with and without shoegear. Pain is relieved with periodic professional debridement.  PCP is Noni Saupe, MD , and last visit was March 09, 2022.  No Known Allergies  Review of Systems: Negative except as noted in the HPI.  Objective:  General: Well developed, nourished, in no acute distress, alert and oriented x 3 .  Vascular Examination:  Dorsalis Pedis artery and Posterior Tibial artery pedal pulses are 2/4 bilateral with immediate capillary fill time. Pedal hair growth present. No varicosities and no lower extremity edema present bilateral.   Dermatological:  Skin is warm, dry and supple bilateral. Nails x 10 are well manicured; remaining integument appears unremarkable at this time. Porokeratotic lesion(s) submet head 1  right foot, submet head 2 right foot, and submet head 5 right foot. No erythema, no edema, no drainage, no fluctuance. There are no open sores, no preulcerative lesions, no rash or signs of infection present. Interdigital maceration noted 1st-4th webspace(s) bilaterally. No blistering, no weeping, no open wounds.  Neurological Examination: Pt has subjective symptoms of neuropathy. Grossly intact via light touch bilateral. Vibratory intact via tuning fork bilateral. Protective threshold with Semmes Weinstein monofilament intact to all pedal sites bilateral.   Musculoskeletal Examination:  No gross bony pedal deformities bilateral. No pain, crepitus, or  limitation noted with foot and ankle range of motion bilateral. Muscular strength 5/5 in all groups tested bilateral.  Assessment/Plan: 1. Porokeratosis   2. Tinea pedis of both feet   3. Pernicious anemia   4. Neuropathy of both feet   -Examined patient. -For interdigital tinea pedis, Rx sent for fluconazole 150 mg po once weekly for 4 weeks. -Patient to continue soft, supportive shoe gear daily. -Porokeratotic lesion(s) submet head 1 right foot, submet head 2 right foot, and submet head 5 right foot pared and enucleated with sterile scalpel blade without incident. Total number of lesions debrided=3. -Rx written for Miconazole Spray Powder 2% antifungal spray powder. Spray between toes once daily. -Patient/POA to call should there be question/concern in the interim.   Return in about 3 months (around 08/10/2022).  Freddie Breech, DPM

## 2022-05-25 ENCOUNTER — Ambulatory Visit: Payer: PPO | Admitting: Podiatrist

## 2022-05-29 DIAGNOSIS — M1990 Unspecified osteoarthritis, unspecified site: Secondary | ICD-10-CM | POA: Diagnosis not present

## 2022-05-29 DIAGNOSIS — E7800 Pure hypercholesterolemia, unspecified: Secondary | ICD-10-CM | POA: Diagnosis not present

## 2022-05-29 DIAGNOSIS — Z79899 Other long term (current) drug therapy: Secondary | ICD-10-CM | POA: Diagnosis not present

## 2022-05-29 DIAGNOSIS — M7918 Myalgia, other site: Secondary | ICD-10-CM | POA: Diagnosis not present

## 2022-05-29 DIAGNOSIS — Z6836 Body mass index (BMI) 36.0-36.9, adult: Secondary | ICD-10-CM | POA: Diagnosis not present

## 2022-05-29 DIAGNOSIS — D510 Vitamin B12 deficiency anemia due to intrinsic factor deficiency: Secondary | ICD-10-CM | POA: Diagnosis not present

## 2022-06-07 DIAGNOSIS — Z6836 Body mass index (BMI) 36.0-36.9, adult: Secondary | ICD-10-CM | POA: Diagnosis not present

## 2022-06-07 DIAGNOSIS — M353 Polymyalgia rheumatica: Secondary | ICD-10-CM | POA: Diagnosis not present

## 2022-06-07 DIAGNOSIS — M1991 Primary osteoarthritis, unspecified site: Secondary | ICD-10-CM | POA: Diagnosis not present

## 2022-06-07 DIAGNOSIS — E669 Obesity, unspecified: Secondary | ICD-10-CM | POA: Diagnosis not present

## 2022-06-15 ENCOUNTER — Other Ambulatory Visit: Payer: Self-pay | Admitting: Hematology and Oncology

## 2022-06-15 DIAGNOSIS — D649 Anemia, unspecified: Secondary | ICD-10-CM

## 2022-06-19 ENCOUNTER — Inpatient Hospital Stay: Payer: PPO | Attending: Hematology and Oncology | Admitting: Hematology and Oncology

## 2022-06-19 ENCOUNTER — Encounter: Payer: Self-pay | Admitting: Hematology and Oncology

## 2022-06-19 ENCOUNTER — Inpatient Hospital Stay: Payer: PPO

## 2022-06-19 DIAGNOSIS — Z79899 Other long term (current) drug therapy: Secondary | ICD-10-CM | POA: Diagnosis not present

## 2022-06-19 DIAGNOSIS — E785 Hyperlipidemia, unspecified: Secondary | ICD-10-CM | POA: Diagnosis not present

## 2022-06-19 DIAGNOSIS — D649 Anemia, unspecified: Secondary | ICD-10-CM

## 2022-06-19 DIAGNOSIS — M79603 Pain in arm, unspecified: Secondary | ICD-10-CM | POA: Diagnosis not present

## 2022-06-19 DIAGNOSIS — Z87891 Personal history of nicotine dependence: Secondary | ICD-10-CM | POA: Diagnosis not present

## 2022-06-19 DIAGNOSIS — D510 Vitamin B12 deficiency anemia due to intrinsic factor deficiency: Secondary | ICD-10-CM | POA: Diagnosis not present

## 2022-06-19 HISTORY — DX: Anemia, unspecified: D64.9

## 2022-06-19 LAB — FERRITIN: Ferritin: 76 ng/mL (ref 24–336)

## 2022-06-19 LAB — CBC AND DIFFERENTIAL
HCT: 40 — AB (ref 41–53)
Hemoglobin: 13.4 — AB (ref 13.5–17.5)
Neutrophils Absolute: 7.55
Platelets: 235 10*3/uL (ref 150–400)
WBC: 9.8

## 2022-06-19 LAB — CBC
MCV: 90 (ref 80–94)
RBC: 4.3 (ref 3.87–5.11)

## 2022-06-19 LAB — HEPATIC FUNCTION PANEL
ALT: 41 U/L — AB (ref 10–40)
AST: 33 (ref 14–40)
Alkaline Phosphatase: 107 (ref 25–125)
Bilirubin, Total: 0.7

## 2022-06-19 LAB — VITAMIN B12: Vitamin B-12: 1227 pg/mL — ABNORMAL HIGH (ref 180–914)

## 2022-06-19 LAB — COMPREHENSIVE METABOLIC PANEL
Albumin: 4.2 (ref 3.5–5.0)
Calcium: 9.3 (ref 8.7–10.7)

## 2022-06-19 LAB — TSH: TSH: 1.584 u[IU]/mL (ref 0.350–4.500)

## 2022-06-19 LAB — LACTATE DEHYDROGENASE: LDH: 136 U/L (ref 98–192)

## 2022-06-19 LAB — BASIC METABOLIC PANEL
BUN: 21 (ref 4–21)
CO2: 24 — AB (ref 13–22)
Chloride: 102 (ref 99–108)
Creatinine: 0.6 (ref 0.6–1.3)
Glucose: 185
Potassium: 4.5 mEq/L (ref 3.5–5.1)
Sodium: 135 — AB (ref 137–147)

## 2022-06-19 LAB — FOLATE: Folate: 11.9 ng/mL (ref >5.9)

## 2022-06-19 NOTE — Progress Notes (Signed)
[facility]  [address] [place],  [place]  [id number] [phone]  Clinic Day:  06/19/2022  Referring physician: Angelina Sheriff, MD   REASON FOR CONSULTATION:  Anemia  HISTORY OF PRESENT ILLNESS:  Nicholas Rowland is a 76 y.o. male with anemia who is referred in consultation by Dr. Lovette Cliche for assessment and management.  The patient has had borderline anemia since October 2022, at which time his hemoglobin was 13.8 with an MCV of 91, normal white blood cell count and platelets.  At a routine visit on July 11, his hemoglobin was 12.7 with an MCV of 88, white blood cell count 7.6 with a normal differential and platelets 260,000.  The patient states he has not previously been told he was anemic.  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  Preoperatively his hemoglobin was 14.4, postoperatively his was 12.7.  The patient denies having transfusion.  The patient denies any overt form of blood loss, such as melena, hematochezia or hematuria.  He previously donated blood for regularly but not for the last 1 to 2 years.  He eats a regular diet.  He states he was recently referred to another provider for shoulder and arm pain.  He is currently on prednisone 5 mg 3 times daily, with instructions to start tapering on August 3.  I do not have those records.  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  He states he had an EGD and colonoscopy this year with Dr. Melina Copa looked good.  Routine follow-up was not recommended.  His records indicate he has hyperlipidemia, but the patient states he is not on any medication for this.  He has had a left knee replacement about 5 years ago, as well as back surgery for spinal stenosis about 15 years ago.  Other than the prednisone listed above, he is only on pantoprazole daily.    His brother had a history of esophageal cancer at age 68 and  eventually succumbed to his disease.  The patient states his mother had an unknown cancer at age 60 and also succumbed to her disease.  REVIEW OF SYSTEMS:  Review of Systems  Constitutional:  Negative for appetite change, chills, fatigue, fever and unexpected weight change.  HENT:   Negative for lump/mass, mouth sores and sore throat.   Respiratory:  Negative for cough and shortness of breath.   Cardiovascular:  Negative for chest pain and leg swelling.  Gastrointestinal:  Negative for abdominal pain, constipation, diarrhea, nausea and vomiting.  Genitourinary:  Negative for difficulty urinating, dysuria, frequency and hematuria.   Musculoskeletal:  Positive for arthralgias and back pain. Negative for myalgias.  Skin:  Negative for itching, rash and wound.  Neurological:  Negative for dizziness, extremity weakness, headaches, light-headedness and numbness.  Hematological:  Negative for adenopathy.  Psychiatric/Behavioral:  Negative for depression and sleep disturbance. The patient is not nervous/anxious.      VITALS:  Blood pressure (!) 146/85, pulse 65, temperature 97.8 F (36.6 C), temperature source Oral, resp. rate 18, height 6\' 4"  (1.93 m), weight 295 lb 4.8 oz (133.9 kg), SpO2 94 %.  Wt Readings from Last 3 Encounters:  06/19/22 295 lb 4.8 oz (133.9 kg)  03/13/22 (!) 301 lb (136.5 kg)  03/02/22 (!) 301 lb (136.5 kg)    Body mass index is 35.95 kg/m.  Performance status (ECOG): 0 - Asymptomatic  PHYSICAL EXAM:  Physical Exam Vitals and nursing note reviewed.  Constitutional:      General: He is not in acute distress.    Appearance: Normal appearance. He is normal weight.  HENT:     Head: Normocephalic and atraumatic.     Mouth/Throat:     Mouth: Mucous membranes are moist.     Pharynx: Oropharynx is clear. No oropharyngeal exudate or posterior oropharyngeal erythema.  Eyes:     General: No scleral icterus.    Extraocular Movements: Extraocular movements intact.      Conjunctiva/sclera: Conjunctivae normal.     Pupils: Pupils are equal, round, and reactive to light.  Cardiovascular:     Rate and Rhythm: Normal rate and regular rhythm.     Heart sounds: Normal heart sounds. No murmur heard.    No friction rub. No gallop.  Pulmonary:     Effort: Pulmonary effort is normal.     Breath sounds: Normal breath sounds. No wheezing, rhonchi or rales.  Abdominal:     General: Bowel sounds are normal. There is no distension.     Palpations: Abdomen is soft. There is no hepatomegaly, splenomegaly or mass.     Tenderness: There is no abdominal tenderness.  Musculoskeletal:        General: Normal range of motion.     Cervical back: Normal range of motion and neck supple. No tenderness.     Right lower leg: No edema.     Left lower leg: No edema.  Lymphadenopathy:     Cervical: No cervical adenopathy.     Upper Body:     Right upper body: No supraclavicular or axillary adenopathy.     Left upper body: No supraclavicular or axillary adenopathy.     Lower Body: No right inguinal adenopathy. No left inguinal adenopathy.  Skin:    General: Skin is warm and dry.     Coloration: Skin is not jaundiced.     Findings: No rash.  Neurological:     Mental Status: He is alert and oriented to person, place, and time.     Cranial Nerves: No cranial nerve deficit.  Psychiatric:        Mood and Affect: Mood normal.        Behavior: Behavior normal.        Thought Content: Thought content normal.     LABS:      Latest Ref Rng & Units 06/19/2022   12:00 AM 03/14/2022    3:32 AM 03/02/2022    9:27 AM  CBC  WBC  9.8     15.6  9.9   Hemoglobin 13.5 - 17.5 13.4     12.7  14.2   Hematocrit 41 - 53 40     38.5  42.1   Platelets 150 - 400 K/uL 235     200  150      This result is from an external source.      Latest Ref Rng & Units 06/19/2022   12:00 AM 03/14/2022    3:32 AM 03/02/2022    9:27 AM  CMP  Glucose 70 - 99 mg/dL  502  774   BUN 4 - 21 21     16  26     Creatinine 0.6 - 1.3 0.6     0.65  0.72   Sodium 137 - 147 135     135  137   Potassium 3.5 - 5.1 mEq/L 4.5     4.1  4.9  Chloride 99 - 108 102     104  106   CO2 13 - 22 24     23  25    Calcium 8.7 - 10.7 9.3     8.4  9.4   Total Protein 6.5 - 8.1 g/dL   7.6   Total Bilirubin 0.3 - 1.2 mg/dL   2.5   Alkaline Phos 25 - 125 107      82   AST 14 - 40 33      40   ALT 10 - 40 U/L 41      23      This result is from an external source.     No results found for: "CEA1", "CEA" / No results found for: "CEA1", "CEA" No results found for: "PSA1" No results found for: " " No results found for: "CAN125"  No results found for: "TOTALPROTELP", "ALBUMINELP", "A1GS", "A2GS", "BETS", "BETA2SER", "GAMS", "MSPIKE", "SPEI" Lab Results  Component Value Date   FERRITIN 76 06/19/2022   Lab Results  Component Value Date   LDH 136 06/19/2022    STUDIES:  No results found.    HISTORY:   Past Medical History:  Diagnosis Date   Anemia 06/19/2022   Barrett esophagus    Barrett's esophagus    GERD (gastroesophageal reflux disease)    History of bronchitis    Osteoarthritis of left knee    Severe    Past Surgical History:  Procedure Laterality Date   BACK SURGERY  2003   L4-L5   COLONOSCOPY     ESOPHAGOGASTRODUODENOSCOPY     HIP SURGERY Left    TOTAL HIP ARTHROPLASTY Left 03/13/2022   Procedure: TOTAL HIP ARTHROPLASTY ANTERIOR APPROACH;  Surgeon: 03/15/2022, MD;  Location: [facility];  Service: Orthopedics;  Laterality: Left;   TOTAL KNEE ARTHROPLASTY Left 12/09/2017   Procedure: TOTAL KNEE ARTHROPLASTY;  Surgeon: 12/11/2017, MD;  Location: [facility] OR;  Service: Orthopedics;  Laterality: Left;    Family History  Problem Relation Age of Onset   Breast cancer Mother    Barrett's esophagus Brother    Esophageal cancer Brother     Social History:  reports that he quit smoking about 20 years ago. His smoking use included cigarettes. He has a 45.00 pack-year smoking history. He has  never used smokeless tobacco. He reports that he does not drink alcohol and does not use drugs.The patient was born and raised in [place].  He is married with 3 children.  He is retired from the [employer].  He is alone today.  Allergies: No Known Allergies  Current Medications: Current Outpatient Medications  Medication Sig Dispense Refill   pantoprazole (PROTONIX) 40 MG tablet Take 40 mg by mouth daily.     predniSONE (DELTASONE) 5 MG tablet Take by mouth.     No current facility-administered medications for this visit.     ASSESSMENT & PLAN:   Assessment:  NIRVAN LABAN is a 76 y.o. male mild anemia of uncertain etiology.  Plan: Anemia evaluation including B12, folate, iron studies, LDH, TSH and serum protein electrophoresis.  I will see him back in 2 weeks to review the results.  I discussed the assessment and plan with the patient.  He was provided an opportunity to ask questions and all were answered.  He agreed with the plan and demonstrated an understanding of the instructions.    Thank you for the referral.   I provided 30 minutes of face-to-face time during this encounter and >  50% was spent counseling as documented under my assessment and plan.    Marvia Pickles, PA-C

## 2022-06-21 LAB — PROTEIN ELECTROPHORESIS, SERUM
A/G Ratio: 1.2 (ref 0.7–1.7)
Albumin ELP: 3.8 g/dL (ref 2.9–4.4)
Alpha-1-Globulin: 0.2 g/dL (ref 0.0–0.4)
Alpha-2-Globulin: 0.9 g/dL (ref 0.4–1.0)
Beta Globulin: 1.1 g/dL (ref 0.7–1.3)
Gamma Globulin: 1.2 g/dL (ref 0.4–1.8)
Globulin, Total: 3.3 g/dL (ref 2.2–3.9)
Total Protein ELP: 7.1 g/dL (ref 6.0–8.5)

## 2022-06-25 DIAGNOSIS — H524 Presbyopia: Secondary | ICD-10-CM | POA: Diagnosis not present

## 2022-06-25 DIAGNOSIS — H2513 Age-related nuclear cataract, bilateral: Secondary | ICD-10-CM | POA: Diagnosis not present

## 2022-06-28 ENCOUNTER — Other Ambulatory Visit: Payer: Self-pay | Admitting: Hematology and Oncology

## 2022-06-28 DIAGNOSIS — M353 Polymyalgia rheumatica: Secondary | ICD-10-CM | POA: Diagnosis not present

## 2022-06-28 DIAGNOSIS — M5451 Vertebrogenic low back pain: Secondary | ICD-10-CM | POA: Diagnosis not present

## 2022-06-28 DIAGNOSIS — D649 Anemia, unspecified: Secondary | ICD-10-CM

## 2022-06-28 DIAGNOSIS — Z5189 Encounter for other specified aftercare: Secondary | ICD-10-CM | POA: Diagnosis not present

## 2022-06-29 ENCOUNTER — Inpatient Hospital Stay: Payer: PPO

## 2022-06-29 ENCOUNTER — Inpatient Hospital Stay (INDEPENDENT_AMBULATORY_CARE_PROVIDER_SITE_OTHER): Payer: PPO | Admitting: Hematology and Oncology

## 2022-06-29 ENCOUNTER — Encounter: Payer: Self-pay | Admitting: Hematology and Oncology

## 2022-06-29 DIAGNOSIS — D72829 Elevated white blood cell count, unspecified: Secondary | ICD-10-CM | POA: Insufficient documentation

## 2022-06-29 DIAGNOSIS — D72828 Other elevated white blood cell count: Secondary | ICD-10-CM | POA: Diagnosis not present

## 2022-06-29 DIAGNOSIS — D649 Anemia, unspecified: Secondary | ICD-10-CM

## 2022-06-29 HISTORY — DX: Elevated white blood cell count, unspecified: D72.829

## 2022-06-29 LAB — CBC AND DIFFERENTIAL
HCT: 38 — AB (ref 41–53)
Hemoglobin: 13.1 — AB (ref 13.5–17.5)
Neutrophils Absolute: 9.57
Platelets: 268 10*3/uL (ref 150–400)
WBC: 11

## 2022-06-29 LAB — CBC
MCV: 90 (ref 80–94)
RBC: 4.25 (ref 3.87–5.11)

## 2022-06-29 NOTE — Progress Notes (Signed)
Nicholas Rowland  [address] [place],  [place]  [address] [phone]  Clinic Day:  06/29/2022  Referring physician: Angelina Sheriff, MD  ASSESSMENT & PLAN:   Assessment & Plan: Anemia Normochromic normocytic anemia, for which no specific etiology was found.  There is no evidence of nutritional deficiency, thyroid disease, hemolysis or multiple myeloma contributing to his anemia.  This may be related to his rheumatologic disorder.  As it is mild, I recommend observation.  We will plan to see him back in 3 months for repeat clinical assessment.  Leukocytosis He has mild leukocytosis likely due to steroid use.  We will continue to monitor this.    As above, we will plan to see him back in 3 months.  The patient understands the plans discussed today and is in agreement with them.  He knows to contact our office if he develops concerns prior to his next appointment.   I provided 15 minutes of face-to-face time during this encounter and > 50% was spent counseling as documented under my assessment and plan.    Nicholas Pickles, PA-C  [facility] AT [facility] [address] [place] [address] Dept: [phone] Dept Fax: [phone]   Orders Placed This Encounter  Procedures   CBC and differential    This external order was created through the Results Console.   CBC    This external order was created through the Results Console.   CBC    This order was created through External Result Entry      CHIEF COMPLAINT:  CC: Anemia  Current Treatment: Evaluation  HISTORY OF PRESENT ILLNESS:  Nicholas Rowland is a 76 y.o. male with anemia who is referred in consultation by Dr. Lovette Rowland for assessment and management.  The patient has had borderline anemia since October 2022, at which time his hemoglobin was 13.8 with an MCV of 91, normal white blood cell count and platelets.  At a routine visit  on July 11, his hemoglobin was 12.7 with an MCV of 88, white blood cell count 7.6 with a normal differential and platelets 260,000.  The patient states he has not previously been told he was anemic.  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  Preoperatively his hemoglobin was 14.4, postoperatively his hemoglobin was 12.7.  The patient denies having transfusion.  The patient denies any overt form of blood loss, such as melena, hematochezia or hematuria.  He previously donated blood for regularly but not for the last 1 to 2 years.  He eats a regular diet.   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  He states he had an EGD and colonoscopy this year with Dr. Melina Rowland looked good.  Routine follow-up was not recommended.  His records indicate he has hyperlipidemia, but the patient states he is not on any medication for this.  He has had a left knee replacement about 5 years ago, as well as back surgery for spinal stenosis about 15 years ago.  He was recently referred to rheumatology.  INTERVAL HISTORY:  Nicholas Rowland is here today to review the laboratory evaluation of his anemia.  He denies progressive fatigue concerning for worsening anemia.  He denies any overt form of blood loss.  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  He was given a Kenalog injection and started back on prednisone 5 mg 3 times daily, which he had been tapering at his last visit.  He states his generalized arthritis pain is well-controlled at this time.  He saw his orthopedic surgeon yesterday and no showed all recommendations were made.  He denies fevers or chills.  His appetite is good. His weight has increased 3 pounds over last 10 days .  REVIEW OF SYSTEMS:  Review of Systems  Constitutional:  Negative for appetite change, chills, fatigue, fever and unexpected weight change.   HENT:   Negative for lump/mass, mouth sores and sore throat.   Respiratory:  Negative for cough and shortness of breath.   Cardiovascular:  Negative for chest pain and leg swelling.  Gastrointestinal:  Negative for abdominal pain, constipation, diarrhea, nausea and vomiting.  Genitourinary:  Negative for difficulty urinating, dysuria, frequency and hematuria.   Musculoskeletal:  Negative for arthralgias, back pain and myalgias.  Skin:  Negative for itching, rash and wound.  Neurological:  Negative for dizziness, extremity weakness, headaches, light-headedness and numbness.  Hematological:  Negative for adenopathy.  Psychiatric/Behavioral:  Negative for depression and sleep disturbance. The patient is not nervous/anxious.      VITALS:  Blood pressure 128/88, pulse (!) 102, temperature 97.8 F (36.6 C), temperature source Oral, resp. rate 20, height '6\' 4"'  (1.93 m), weight 298 lb 4.8 oz (135.3 kg), SpO2 97 %.  Wt Readings from Last 3 Encounters:  06/29/22 298 lb 4.8 oz (135.3 kg)  06/19/22 295 lb 4.8 oz (133.9 kg)  03/13/22 (!) 301 lb (136.5 kg)    Body mass index is 36.31 kg/m.  Performance status (ECOG): 0 - Asymptomatic  PHYSICAL EXAM:  Physical Exam Vitals and nursing note reviewed.  Constitutional:      General: He is not in acute distress.    Appearance: Normal appearance. He is normal weight.  HENT:     Head: Normocephalic and atraumatic.     Mouth/Throat:     Mouth: Mucous membranes are moist.     Pharynx: Oropharynx is clear. No oropharyngeal exudate or posterior oropharyngeal erythema.  Eyes:     General: No scleral icterus.    Extraocular Movements: Extraocular movements intact.     Conjunctiva/sclera: Conjunctivae normal.     Pupils: Pupils are equal, round, and reactive to light.  Cardiovascular:     Rate and Rhythm: Normal rate and regular rhythm.     Heart sounds: Normal heart sounds. No murmur heard.    No friction rub. No gallop.  Pulmonary:     Effort:  Pulmonary effort is normal.     Breath sounds: Normal breath sounds. No wheezing, rhonchi or rales.  Abdominal:     General: Bowel sounds are normal. There is no distension.     Palpations: Abdomen is soft. There is no hepatomegaly, splenomegaly or mass.     Tenderness: There is no abdominal tenderness.  Musculoskeletal:        General: Normal range of motion.     Cervical back: Normal range of motion and neck supple. No tenderness.     Right lower leg: No edema.     Left lower leg: No edema.  Lymphadenopathy:     Cervical: No cervical adenopathy.     Upper Body:     Right upper body: No supraclavicular or axillary adenopathy.     Left upper body: No supraclavicular or axillary adenopathy.     Lower Body:  No right inguinal adenopathy. No left inguinal adenopathy.  Skin:    General: Skin is warm and dry.     Coloration: Skin is not jaundiced.     Findings: No rash.  Neurological:     Mental Status: He is alert and oriented to person, place, and time.     Cranial Nerves: No cranial nerve deficit.  Psychiatric:        Mood and Affect: Mood normal.        Behavior: Behavior normal.        Thought Content: Thought content normal.    LABS:      Latest Ref Rng & Units 06/29/2022   12:00 AM 06/19/2022   12:00 AM 03/14/2022    3:32 AM  CBC  WBC  11.0     9.8     15.6   Hemoglobin 13.5 - 17.5 13.1     13.4     12.7   Hematocrit 41 - 53 38     40     38.5   Platelets 150 - 400 K/uL 268     235     200      This result is from an external source.      Latest Ref Rng & Units 06/19/2022   12:00 AM 03/14/2022    3:32 AM 03/02/2022    9:27 AM  CMP  Glucose 70 - 99 mg/dL  186  140   BUN 4 - '21 21     16  26   ' Creatinine 0.6 - 1.3 0.6     0.65  0.72   Sodium 137 - 147 135     135  137   Potassium 3.5 - 5.1 mEq/L 4.5     4.1  4.9   Chloride 99 - 108 102     104  106   CO2 13 - '22 24     23  25   ' Calcium 8.7 - 10.7 9.3     8.4  9.4   Total Protein 6.5 - 8.1 g/dL   7.6   Total  Bilirubin 0.3 - 1.2 mg/dL   2.5   Alkaline Phos 25 - 125 107      82   AST 14 - 40 33      40   ALT 10 - 40 U/L 41      23      This result is from an external source.     No results found for: "CEA1", "CEA" / No results found for: "CEA1", "CEA" No results found for: "PSA1" No results found for: "CAN199" No results found for: "CAN125"  Lab Results  Component Value Date   TOTALPROTELP 7.1 06/19/2022   ALBUMINELP 3.8 06/19/2022   A1GS 0.2 06/19/2022   A2GS 0.9 06/19/2022   BETS 1.1 06/19/2022   GAMS 1.2 06/19/2022   MSPIKE Not Observed 06/19/2022   SPEI Comment 06/19/2022   Lab Results  Component Value Date   FERRITIN 76 06/19/2022   Lab Results  Component Value Date   LDH 136 06/19/2022    STUDIES:  No results found.    HISTORY:   Past Medical History:  Diagnosis Date   Anemia 06/19/2022   Barrett esophagus    Barrett's esophagus    GERD (gastroesophageal reflux disease)    History of bronchitis    Leukocytosis 06/29/2022   Osteoarthritis of left knee    Severe    Past Surgical History:  Procedure Laterality Date  BACK SURGERY  2003   L4-L5   COLONOSCOPY     ESOPHAGOGASTRODUODENOSCOPY     HIP SURGERY Left    TOTAL HIP ARTHROPLASTY Left 03/13/2022   Procedure: TOTAL HIP ARTHROPLASTY ANTERIOR APPROACH;  Surgeon: Paralee Cancel, MD;  Location: [facility];  Service: Orthopedics;  Laterality: Left;   TOTAL KNEE ARTHROPLASTY Left 12/09/2017   Procedure: TOTAL KNEE ARTHROPLASTY;  Surgeon: Vickey Huger, MD;  Location: [facility];  Service: Orthopedics;  Laterality: Left;    Family History  Problem Relation Age of Onset   Breast cancer Mother    Barrett's esophagus Brother    Esophageal cancer Brother     Social History:  reports that he quit smoking about 20 years ago. His smoking use included cigarettes. He has a 45.00 pack-year smoking history. He has never used smokeless tobacco. He reports that he does not drink alcohol and does not use drugs.The patient is alone  today.  Allergies: Not on File  Current Medications: Current Outpatient Medications  Medication Sig Dispense Refill   HYDROcodone-acetaminophen (NORCO/VICODIN) 5-325 MG tablet Take 1 tablet every 6-8 hours by oral route as needed for 5 days.     fluconazole (DIFLUCAN) 150 MG tablet Take 1 tablet (150 mg total) by mouth every 7 (seven) days for 4 doses.     methylPREDNISolone (MEDROL DOSEPAK) 4 MG TBPK tablet TAKE 6 TABLETS BY MOUTH ON DAY 1, 5 TABLETS ON DAY 2, 4 TABLETS ON DAY 3, 3 TABLETS ON DAY 4, 2 TABLETS ON DAY 5, AND 1 TABLET ON DAY 6.     pantoprazole (PROTONIX) 40 MG tablet Take 40 mg by mouth daily.     No current facility-administered medications for this visit.

## 2022-06-29 NOTE — Assessment & Plan Note (Signed)
He has mild leukocytosis likely due to steroid use.  We will continue to monitor this.

## 2022-06-29 NOTE — Assessment & Plan Note (Addendum)
Normochromic normocytic anemia, for which no specific etiology was found.  There is no evidence of nutritional deficiency, thyroid disease, hemolysis or multiple myeloma contributing to his anemia.  This may be related to his rheumatologic disorder.  As it is mild, I recommend observation.  We will plan to see him back in 3 months for repeat clinical assessment.

## 2022-07-03 DIAGNOSIS — E538 Deficiency of other specified B group vitamins: Secondary | ICD-10-CM | POA: Diagnosis not present

## 2022-08-08 DIAGNOSIS — M353 Polymyalgia rheumatica: Secondary | ICD-10-CM | POA: Diagnosis not present

## 2022-08-08 DIAGNOSIS — M5136 Other intervertebral disc degeneration, lumbar region: Secondary | ICD-10-CM | POA: Diagnosis not present

## 2022-08-08 DIAGNOSIS — Z6836 Body mass index (BMI) 36.0-36.9, adult: Secondary | ICD-10-CM | POA: Diagnosis not present

## 2022-08-08 DIAGNOSIS — Z7952 Long term (current) use of systemic steroids: Secondary | ICD-10-CM | POA: Diagnosis not present

## 2022-08-08 DIAGNOSIS — E669 Obesity, unspecified: Secondary | ICD-10-CM | POA: Diagnosis not present

## 2022-08-08 DIAGNOSIS — M1991 Primary osteoarthritis, unspecified site: Secondary | ICD-10-CM | POA: Diagnosis not present

## 2022-08-16 ENCOUNTER — Ambulatory Visit (INDEPENDENT_AMBULATORY_CARE_PROVIDER_SITE_OTHER): Payer: PPO | Admitting: Podiatry

## 2022-08-16 ENCOUNTER — Encounter: Payer: Self-pay | Admitting: Podiatry

## 2022-08-16 DIAGNOSIS — Q828 Other specified congenital malformations of skin: Secondary | ICD-10-CM | POA: Diagnosis not present

## 2022-08-16 DIAGNOSIS — M79674 Pain in right toe(s): Secondary | ICD-10-CM | POA: Diagnosis not present

## 2022-08-16 DIAGNOSIS — M79675 Pain in left toe(s): Secondary | ICD-10-CM | POA: Diagnosis not present

## 2022-08-16 DIAGNOSIS — G5793 Unspecified mononeuropathy of bilateral lower limbs: Secondary | ICD-10-CM | POA: Diagnosis not present

## 2022-08-16 DIAGNOSIS — L98491 Non-pressure chronic ulcer of skin of other sites limited to breakdown of skin: Secondary | ICD-10-CM | POA: Diagnosis not present

## 2022-08-16 DIAGNOSIS — E538 Deficiency of other specified B group vitamins: Secondary | ICD-10-CM | POA: Diagnosis not present

## 2022-08-16 DIAGNOSIS — B351 Tinea unguium: Secondary | ICD-10-CM | POA: Diagnosis not present

## 2022-08-16 NOTE — Patient Instructions (Signed)
DRESSING CHANGES LEFT 2ND TOE:   PHARMACY SHOPPING LIST: Saline or Wound Cleanser for cleaning wound 2 x 2 inch sterile gauze for cleaning wound BETADINE SOLUTION  1. KEEP LEFT FOOT DRY AT ALL TIMES!!!!  2. CLEANSE ULCER WITH SALINE OR WOUND CLEANSER.  3. DAB DRY WITH GAUZE SPONGE.  4. APPLY A LIGHT AMOUNT OF BETADINE SOLUTION TO BASE OF ULCER.  5. APPLY OUTER DRESSING AS INSTRUCTED.  6. WEAR SURGICAL SHOE/BOOT DAILY AT ALL TIMES. IF SUPPLIED, WEAR HEEL PROTECTORS AT ALL TIMES WHEN IN BED.  7. DO NOT WALK BAREFOOT!!!  8.  IF YOU EXPERIENCE ANY FEVER, CHILLS, NIGHTSWEATS, NAUSEA OR VOMITING, ELEVATED OR LOW BLOOD SUGARS, REPORT TO EMERGENCY ROOM.  9. IF YOU EXPERIENCE INCREASED REDNESS, PAIN, SWELLING, DISCOLORATION, ODOR, PUS, DRAINAGE OR WARMTH OF YOUR FOOT, REPORT TO EMERGENCY ROOM.

## 2022-08-18 NOTE — Progress Notes (Addendum)
  Subjective:  Patient ID: Nicholas Rowland, male    DOB: Mar 31, 1946,  MRN: [id number]  Nicholas Rowland presents to clinic today for:  Chief Complaint  Patient presents with   Nail Problem    Routine foot care    New problem(s): None.   PCP is Angelina Sheriff, MD , and last visit was  May 29, 2022.  Not on File  Review of Systems: Negative except as noted in the HPI.  Objective: No changes noted in today's physical examination.  Nicholas Rowland is a pleasant 76 y.o. male morbidly obese in NAD. AAO x 3. Vascular Examination:  Dorsalis Pedis artery and Posterior Tibial artery pedal pulses are 2/4 bilateral with immediate capillary fill time. Pedal hair growth present. No varicosities and no lower extremity edema present bilateral.   Dermatological:  Skin is warm, dry and supple bilateral. Nails x 10 are well manicured; remaining integument appears unremarkable at this time. Porokeratotic lesion(s) submet head 1 right foot, submet head 2 right foot, and submet head 5 right foot. No erythema, no edema, no drainage, no fluctuance.   Preulcerative lesion noted distal tip of left 2nd toe. There is visible subdermal hemorrhage. There is no surrounding erythema, no edema, no drainage, no odor, no fluctuance. Postdebridement, reveals small transverse partialthickness ulceration measuring 0.2 x 0.1 x 0.1 cm with fibrogranular base.    Neurological Examination: Pt has subjective symptoms of neuropathy. Grossly intact via light touch bilateral. Vibratory intact via tuning fork bilateral. Protective threshold with Semmes Weinstein monofilament intact to all pedal Rowland bilateral.   Musculoskeletal Examination:  Normal muscle strength 5/5 to all lower extremity muscle groups bilaterally. Hammertoe deformity noted 2-5 b/l.Marland Kitchen No pain, crepitus or joint limitation noted with ROM b/l LE.  Patient ambulates independently without assistive aids.  Assessment/Plan: No diagnosis found.  No orders of  the defined types were placed in this encounter.   -Patient was evaluated and treated. All patient's and/or POA's questions/concerns answered on today's visit. -Patient/POA/Family member educated on diagnosis and treatment plan of routine ulcer debridement/wound care.  -Ulceration debridement achieved utilizing sharp excisional debridement with sterile scalpel blade.. Type/amount of devitalized tissue removed: nonviable hyperkeratosis -Today's left 2nd toe ulcer size post-debridement: 0.2 x 0.1 x 0.1 cm. -Ulceration cleansed with wound cleanser. Betadine Solution applied to base of ulceration and secured with light dressing. -Wound responded well to today's debridement. -Patient risk factors affecting healing of ulcer: neuropathy -Nicholas Rowland given written instructions on daily wound care for distal tip of left 2nd toe ulceration. -Mycotic toenails 1-5 bilaterally were debrided in length and girth with sterile nail nippers and dremel without incident. -Callus(es) submet head 1 right foot, submet head 2 right foot, and submet head 5 right foot pared utilizing sterile scalpel blade without complication or incident. Total number debrided =3. -Patient/POA to call should there be question/concern in the interim.  -He is scheduled to follow up with Dr. Loel Lofty in one week.  Return in about 1 week (around 08/23/2022).  Marzetta Board, DPM

## 2022-08-27 ENCOUNTER — Ambulatory Visit (INDEPENDENT_AMBULATORY_CARE_PROVIDER_SITE_OTHER): Payer: PPO | Admitting: Podiatry

## 2022-08-27 DIAGNOSIS — L03116 Cellulitis of left lower limb: Secondary | ICD-10-CM | POA: Diagnosis not present

## 2022-08-27 DIAGNOSIS — L97522 Non-pressure chronic ulcer of other part of left foot with fat layer exposed: Secondary | ICD-10-CM | POA: Diagnosis not present

## 2022-08-27 DIAGNOSIS — I739 Peripheral vascular disease, unspecified: Secondary | ICD-10-CM | POA: Diagnosis not present

## 2022-08-27 MED ORDER — DOXYCYCLINE HYCLATE 100 MG PO TABS: 100.0000 mg | ORAL_TABLET | Freq: Two times a day (BID) | ORAL | 0 refills | Status: AC

## 2022-08-27 NOTE — Progress Notes (Signed)
Subjective:  Patient ID: Nicholas Rowland, male    DOB: 01-03-1946,  MRN: [id number]  Chief Complaint  Patient presents with   Toe Pain    Follow up on 2nd toe ulcer     76 y.o. male presents for follow-up on left second toe ulcer.  He was seen last week by Dr. Adah Perl.  She noticed that he had a ulceration of the distal tuft of the left second toe related to hammertoe deformity and neuropathy.  He reports that he has been putting an antifungal spray on the area.  He does think that it is worsened since last visit he does report that he was walking in a pair of shoes that put too much pressure on the area and was doing too much activity since last visit which could have contributed to the worsening of the red send wound at the left second toe.  He is not currently on antibiotics he is on a steroid for a different issue.  Unclear if he has been actually dressing and wrapping the toe as was recommended.  Past Medical History:  Diagnosis Date   Anemia 06/19/2022   Barrett esophagus    Barrett's esophagus    GERD (gastroesophageal reflux disease)    History of bronchitis    Leukocytosis 06/29/2022   Osteoarthritis of left knee    Severe    Not on File  ROS: Negative except as per HPI above  Objective:  General: AAO x3, NAD  Dermatological: Ulceration present distal aspect left 2nd toe. Erythema present to distal half of the left 2nd toe. Mild edema of left 2nd toe.  Ulceration to the level of subcutaneous fat tissue does not probe to bone no purulent drainage expressed.  There was macerated hyperkeratotic tissue.  Wound is larger than prior measuring 2.5 x 0.5 x 0.2 cm following debridement  Vascular:  Dorsalis Pedis artery and Posterior Tibial artery pedal pulses are 0/4 left foot.  Capillary fill time < 3 sec to all digits.   Neruologic: Grossly intact via light touch bilateral. Protective threshold intact to all sites bilateral.   Musculoskeletal: No gross boney pedal deformities  bilateral. No pain, crepitus, or limitation noted with foot and ankle range of motion bilateral. Muscular strength 5/5 in all groups tested bilateral.  Gait: Unassisted, Nonantalgic.     Radiographs:  Deferred at this visit related to IT issues with x-ray machine  Assessment:   1. Ulcer of toe of left foot, with fat layer exposed ([place])   2. Cellulitis of foot, left   3. PAD (peripheral artery disease) ([place])      Plan:  Patient was evaluated and treated and all questions answered.  Ulcer distal aspect left second toe to the level of subcutaneous fat tissue mild cellulitis of the left second toe no evidence of deep infection at this time. -We discussed the etiology and factors that are a part of the wound healing process.  We also discussed the risk of infection both soft tissue and osteomyelitis from open ulceration.  Discussed the risk of limb loss if this happens or worsens. -Debridement as below. -Dressed with Betadine, DSD. -Continue home dressing changes daily with Betadine gauze and gauze wrap  -Vascular testing yes.  Patient referred for ABI testing related to diminished pedal pulses present on the left foot.  We will follow-up results and determine if vascular referral is necessary. -HgbA1c: Unknown patient is not diabetic. -Last antibiotics: Recommend a course of doxycycline 100 mg twice daily for  10 days due to increasing soft tissue infection of the distal aspect of the left second toe. -Imaging: Deferred at this visit related to IT issues with our x-ray machine will consider at next visit  Procedure: Excisional Debridement of Wound Rationale: Removal of non-viable soft tissue from the wound to promote healing.  Anesthesia: none Post-Debridement Wound Measurements: 2.5 cm x 0.5 cm x 0.2 cm  Type of Debridement: Sharp Excisional with 10 blade scalpel Tissue Removed: Non-viable soft tissue Depth of Debridement: subcutaneous tissue. Technique: Sharp excisional debridement  to bleeding, viable wound base.  Dressing: Dry, sterile, compression dressing. Disposition: Patient tolerated procedure well.   Return in about 2 weeks (around 09/10/2022) for Left 2nd toe ulceration.           Corinna Gab, DPM [facility] / [facility]

## 2022-08-27 NOTE — Progress Notes (Deleted)
  Subjective:  Patient ID: Nicholas Rowland, male    DOB: 1946/01/27,  MRN: [id number]  Chief Complaint  Patient presents with   Toe Pain    Follow up on 2nd toe ulcer     76 y.o. male presents ***  Past Medical History:  Diagnosis Date   Anemia 06/19/2022   Barrett esophagus    Barrett's esophagus    GERD (gastroesophageal reflux disease)    History of bronchitis    Leukocytosis 06/29/2022   Osteoarthritis of left knee    Severe    Not on File  ROS: Negative except as per HPI above  Objective:  General: AAO x3, NAD  Dermatological: Ulceration present distal aspect left 2nd toe. Erythema present to distal half of the left 2nd toe. Mild edema of left 2nd toe.   Vascular:  Dorsalis Pedis artery and Posterior Tibial artery pedal pulses are 0/4 left foot.  Capillary fill time < 3 sec to all digits.   Neruologic: Grossly intact via light touch bilateral. Protective threshold intact to all sites bilateral.   Musculoskeletal: No gross boney pedal deformities bilateral. No pain, crepitus, or limitation noted with foot and ankle range of motion bilateral. Muscular strength 5/5 in all groups tested bilateral.  Gait: Unassisted, Nonantalgic.     Radiographs:  Deferred at this visit.  Assessment:  No diagnosis found.   Plan:  Patient was evaluated and treated and all questions answered.  Ulcer *** -We discussed the etiology and factors that are a part of the wound healing process.  We also discussed the risk of infection both soft tissue and osteomyelitis from open ulceration.  Discussed the risk of limb loss if this happens or worsens. -Debridement as below. -Dressed with ***, DSD. -Continue home dressing changes {Frequencies; daily to monthly:19428} with [place] DRESSING TYPE:109023} and *** -Continue off-loading with {Wound Offloading:26591}. -Vascular testing *** -HgbA1c: *** -Last antibiotics: *** -Imaging: {imaging; generic:304450004} reviewed, shows {Wound  Imaging:26627}.  Procedure: Excisional Debridement of Wound Rationale: Removal of non-viable soft tissue from the wound to promote healing.  Anesthesia: none Post-Debridement Wound Measurements: *** cm x *** cm x *** cm  Type of Debridement: Sharp Excisional Tissue Removed: Non-viable soft tissue Depth of Debridement: subcutaneous tissue. Technique: Sharp excisional debridement to bleeding, viable wound base.  Dressing: Dry, sterile, compression dressing. Disposition: Patient tolerated procedure well.   Return in about 2 weeks (around 09/10/2022) for Left 2nd toe ulceration.       No follow-ups on file.          Everitt Amber, DPM [facility]

## 2022-08-30 ENCOUNTER — Telehealth: Payer: Self-pay

## 2022-08-30 ENCOUNTER — Other Ambulatory Visit: Payer: Self-pay

## 2022-08-30 DIAGNOSIS — L97522 Non-pressure chronic ulcer of other part of left foot with fat layer exposed: Secondary | ICD-10-CM

## 2022-08-30 NOTE — Telephone Encounter (Signed)
Prior authorization has been started for the Vascular Ultrasound ABI today on 08/30/22.

## 2022-09-07 DIAGNOSIS — M1990 Unspecified osteoarthritis, unspecified site: Secondary | ICD-10-CM | POA: Diagnosis not present

## 2022-09-07 DIAGNOSIS — E1165 Type 2 diabetes mellitus with hyperglycemia: Secondary | ICD-10-CM | POA: Diagnosis not present

## 2022-09-07 DIAGNOSIS — Z1331 Encounter for screening for depression: Secondary | ICD-10-CM | POA: Diagnosis not present

## 2022-09-07 DIAGNOSIS — E7800 Pure hypercholesterolemia, unspecified: Secondary | ICD-10-CM | POA: Diagnosis not present

## 2022-09-07 DIAGNOSIS — Z91199 Patient's noncompliance with other medical treatment and regimen due to unspecified reason: Secondary | ICD-10-CM | POA: Diagnosis not present

## 2022-09-07 DIAGNOSIS — Z0000 Encounter for general adult medical examination without abnormal findings: Secondary | ICD-10-CM | POA: Diagnosis not present

## 2022-09-07 DIAGNOSIS — Z6837 Body mass index (BMI) 37.0-37.9, adult: Secondary | ICD-10-CM | POA: Diagnosis not present

## 2022-09-10 ENCOUNTER — Ambulatory Visit: Payer: PPO | Admitting: Podiatry

## 2022-09-18 ENCOUNTER — Ambulatory Visit: Payer: PPO | Attending: Cardiology

## 2022-09-18 ENCOUNTER — Ambulatory Visit (INDEPENDENT_AMBULATORY_CARE_PROVIDER_SITE_OTHER): Payer: PPO

## 2022-09-18 ENCOUNTER — Ambulatory Visit (INDEPENDENT_AMBULATORY_CARE_PROVIDER_SITE_OTHER): Payer: PPO | Admitting: Podiatry

## 2022-09-18 DIAGNOSIS — L97522 Non-pressure chronic ulcer of other part of left foot with fat layer exposed: Secondary | ICD-10-CM

## 2022-09-18 DIAGNOSIS — I739 Peripheral vascular disease, unspecified: Secondary | ICD-10-CM | POA: Diagnosis not present

## 2022-09-18 DIAGNOSIS — L03116 Cellulitis of left lower limb: Secondary | ICD-10-CM | POA: Diagnosis not present

## 2022-09-18 NOTE — Progress Notes (Signed)
    Subjective:  Patient ID: Nicholas Rowland, male    DOB: [birth year],  MRN: [id number]  Chief Complaint  Patient presents with   Wound Check    2 weeks for left 2nd toe ulceration. Occasionally.     76 y.o. male presents for follow-up on left second toe ulcer.  He states the 2nd toe is doing much better since last visit.  Notes that the wound has been dressed with bandage and Betadine as previously recommended.  Overall he feels that the area is fully healed and not concerned with it at this time.  Denies any nausea vomiting fever chills.  Past Medical History:  Diagnosis Date   Anemia 06/19/2022   Barrett esophagus    Barrett's esophagus    GERD (gastroesophageal reflux disease)    History of bronchitis    Leukocytosis 06/29/2022   Osteoarthritis of left knee    Severe    No Known Allergies  ROS: Negative except as per HPI above  Objective:  General: AAO x3, NAD  Dermatological: Callus present distal aspect L 2nd toe. No open ulceration at this point, well healed from prior. Decreased erythema of the 2nd toe from prior, decreased edema.   Vascular:  Dorsalis Pedis artery and Posterior Tibial artery pedal pulses are 0/4 left foot.  Capillary fill time < 3 sec to all digits.   Neruologic: Grossly intact via light touch bilateral. Protective threshold intact to all sites bilateral.   Musculoskeletal: No gross boney pedal deformities bilateral. No pain, crepitus, or limitation noted with foot and ankle range of motion bilateral. Muscular strength 5/5 in all groups tested bilateral.  Gait: Unassisted, Nonantalgic.      Radiographs:  09/18/22 XR foot left 3 views ap lateral oblique No evidence of osseous erosion or soft tissue infection/gas in the 2nd toe left foot Assessment:   1. Ulcer of toe of left foot, with fat layer exposed ([place])   2. PAD (peripheral artery disease) (HCC)   3. Cellulitis of foot, left       Plan:  Patient was evaluated and treated and all  questions answered.  Ulcer distal aspect left second toe to the level of subcutaneous fat tissue mild cellulitis of the left second toe no evidence of deep infection at this time. -Healed at this point, continue to monitor.  Patient does not need to apply antibiotic ointment or Band-Aid at this point time. -Dispensed gel toe cap to wear for protection on the left 2nd toe -Vascular testing yes.  Patient referred for ABI testing related to diminished pedal pulses present on the left foot.   -HgbA1c: recently 8.3 -Last antibiotics: No further abx indicated -Imaging: XR as above no indication of OM left 2nd toe -Patient will continue to monitor the left second toe and call us with any questions or concerns including redness swelling drainage or swelling.  Otherwise he can follow-up for routine foot care with Dr. Adah Perl as previously scheduled.   Return for follow up for [place] with Dr. Adah Perl.           Everitt Amber, DPM [facility]

## 2022-09-26 ENCOUNTER — Inpatient Hospital Stay: Payer: PPO | Attending: Hematology and Oncology

## 2022-09-26 ENCOUNTER — Encounter: Payer: Self-pay | Admitting: Hematology and Oncology

## 2022-09-26 ENCOUNTER — Inpatient Hospital Stay (INDEPENDENT_AMBULATORY_CARE_PROVIDER_SITE_OTHER): Payer: PPO | Admitting: Hematology and Oncology

## 2022-09-26 ENCOUNTER — Telehealth: Payer: Self-pay | Admitting: Hematology and Oncology

## 2022-09-26 VITALS — BP 144/78 | HR 66 | Temp 98.0°F | Resp 18 | Ht 76.0 in | Wt 292.3 lb

## 2022-09-26 DIAGNOSIS — E119 Type 2 diabetes mellitus without complications: Secondary | ICD-10-CM | POA: Diagnosis not present

## 2022-09-26 DIAGNOSIS — Z87891 Personal history of nicotine dependence: Secondary | ICD-10-CM | POA: Insufficient documentation

## 2022-09-26 DIAGNOSIS — M353 Polymyalgia rheumatica: Secondary | ICD-10-CM | POA: Insufficient documentation

## 2022-09-26 DIAGNOSIS — D649 Anemia, unspecified: Secondary | ICD-10-CM

## 2022-09-26 DIAGNOSIS — D72829 Elevated white blood cell count, unspecified: Secondary | ICD-10-CM | POA: Diagnosis not present

## 2022-09-26 DIAGNOSIS — Z79899 Other long term (current) drug therapy: Secondary | ICD-10-CM | POA: Insufficient documentation

## 2022-09-26 DIAGNOSIS — Z7984 Long term (current) use of oral hypoglycemic drugs: Secondary | ICD-10-CM | POA: Diagnosis not present

## 2022-09-26 DIAGNOSIS — Z96642 Presence of left artificial hip joint: Secondary | ICD-10-CM | POA: Diagnosis not present

## 2022-09-26 LAB — LACTATE DEHYDROGENASE: LDH: 156 U/L (ref 98–192)

## 2022-09-26 LAB — CBC WITH DIFFERENTIAL (CANCER CENTER ONLY)
Abs Immature Granulocytes: 0.03 10*3/uL (ref 0.00–0.07)
Basophils Absolute: 0 10*3/uL (ref 0.0–0.1)
Basophils Relative: 1 %
Eosinophils Absolute: 0.1 10*3/uL (ref 0.0–0.5)
Eosinophils Relative: 1 %
HCT: 41.2 % (ref 39.0–52.0)
Hemoglobin: 13.7 g/dL (ref 13.0–17.0)
Immature Granulocytes: 1 %
Lymphocytes Relative: 20 %
Lymphs Abs: 1.2 10*3/uL (ref 0.7–4.0)
MCH: 32.5 pg (ref 26.0–34.0)
MCHC: 33.3 g/dL (ref 30.0–36.0)
MCV: 97.9 fL (ref 80.0–100.0)
Monocytes Absolute: 0.6 10*3/uL (ref 0.1–1.0)
Monocytes Relative: 9 %
Neutro Abs: 4.1 10*3/uL (ref 1.7–7.7)
Neutrophils Relative %: 68 %
Platelet Count: 233 10*3/uL (ref 150–400)
RBC: 4.21 MIL/uL — ABNORMAL LOW (ref 4.22–5.81)
RDW: 14.2 % (ref 11.5–15.5)
WBC Count: 6 10*3/uL (ref 4.0–10.5)
nRBC: 0 % (ref 0.0–0.2)

## 2022-09-26 LAB — CMP (CANCER CENTER ONLY)
ALT: 34 U/L (ref 0–44)
AST: 32 U/L (ref 15–41)
Albumin: 4.1 g/dL (ref 3.5–5.0)
Alkaline Phosphatase: 75 U/L (ref 38–126)
Anion gap: 11 (ref 5–15)
BUN: 16 mg/dL (ref 8–23)
CO2: 24 mmol/L (ref 22–32)
Calcium: 9.5 mg/dL (ref 8.9–10.3)
Chloride: 105 mmol/L (ref 98–111)
Creatinine: 0.67 mg/dL (ref 0.61–1.24)
GFR, Estimated: >60 mL/min (ref >60)
Glucose, Bld: 232 mg/dL — ABNORMAL HIGH (ref 70–99)
Potassium: 4.4 mmol/L (ref 3.5–5.1)
Sodium: 140 mmol/L (ref 135–145)
Total Bilirubin: 0.8 mg/dL (ref 0.3–1.2)
Total Protein: 7.4 g/dL (ref 6.5–8.1)

## 2022-09-26 LAB — IRON AND TIBC
Iron: 74 ug/dL (ref 45–182)
Saturation Ratios: 19 % (ref 17.9–39.5)
TIBC: 393 ug/dL (ref 250–450)
UIBC: 319 ug/dL

## 2022-09-26 LAB — FERRITIN: Ferritin: 44 ng/mL (ref 24–336)

## 2022-09-26 LAB — FOLATE: Folate: 18.3 ng/mL (ref >5.9)

## 2022-09-26 LAB — VITAMIN B12: Vitamin B-12: 1248 pg/mL — ABNORMAL HIGH (ref 180–914)

## 2022-09-26 NOTE — Assessment & Plan Note (Addendum)
Normochromic normocytic anemia, for which no specific etiology was found.  There is no evidence of nutritional deficiency, thyroid disease, hemolysis or multiple myeloma contributing to his anemia.  His hemoglobin is normal today.  We will plan to see him back in 3 months for repeat clinical assessment.

## 2022-09-26 NOTE — Progress Notes (Signed)
[place]  [address] [place],  [place]  [id number] [phone]  Clinic Day:  09/26/2022  Referring physician: Angelina Sheriff, MD  ASSESSMENT & PLAN:   Assessment & Plan: Anemia Normochromic normocytic anemia, for which no specific etiology was found.  There is no evidence of nutritional deficiency, thyroid disease, hemolysis or multiple myeloma contributing to his anemia.  His hemoglobin is normal today.  We will plan to see him back in 3 months for repeat clinical assessment.   Leukocytosis The leukocytosis has resolved.  There was one immature granulocyte seen on peripheral smear, so we will continue to monitor him.   The patient understands the plans discussed today and is in agreement with them.  He knows to contact our office if he develops concerns prior to his next appointment.    Marvia Pickles, PA-C  [facility] [address] [place] [address] Dept: [phone] Dept Fax: [phone]   No orders of the defined types were placed in this encounter.     CHIEF COMPLAINT:  CC: Anemia of uncertain etiology  Current Treatment: Observation  HISTORY OF PRESENT ILLNESS:  Nicholas Rowland is a 76 y.o. male with normochromic normocytic anemia who I began seeing in August.  The patient has had borderline anemia since October 2022, at which time his hemoglobin was 13.8 with an MCV of 91, normal white blood cell count and platelets.  At a routine visit on July 11, his hemoglobin was 12.7 with an MCV of 88, white blood cell count 7.6 with a normal differential and platelets 260,000.  The patient states he has not previously been told he was anemic.  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  Preoperatively his hemoglobin was 14.4, postoperatively his hemoglobin was 12.7. He previously donated blood for regularly but had not done so recently. 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  He stated he had an EGD and colonoscopy in the past year year with Dr. Melina Copa looked good.  Routine follow-up was not recommended.  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  INTERVAL HISTORY:  Maurico is here today for repeat clinical assessment.  He states he feels quite well.  He denies progressive fatigue concerning for worsening anemia. He denies fevers, chills or night sweats.  He denies recurrent infection. He denies pain. His appetite is good, but he is trying to lose weight to better manage his diabetes, as he has been started on Jardiance. His weight has decreased 6 pounds over last 3 months .  He saw Dr. Amil Amen in rheumatology and states he has been slowly tapering his prednisone.  The patient is currently on 5 mg twice a day and sees Dr. Amil Amen again this month.  REVIEW OF SYSTEMS:  Review of Systems  Constitutional:  Negative for appetite change, chills, fatigue, fever and unexpected weight change.  HENT:   Negative for lump/mass, mouth sores and sore throat.   Respiratory:  Negative for cough and shortness of breath.   Cardiovascular:  Negative for chest pain and leg swelling.  Gastrointestinal:  Negative for abdominal pain, constipation, diarrhea, nausea and vomiting.  Genitourinary:  Negative for difficulty urinating, dysuria, frequency and hematuria.   Musculoskeletal:  Negative for arthralgias, back pain and myalgias.  Skin:  Negative for itching, rash and wound.  Neurological:  Negative for dizziness, extremity weakness,  headaches, light-headedness and numbness.  Hematological:  Negative for adenopathy.  Psychiatric/Behavioral:  Negative for depression and sleep disturbance. The patient is not nervous/anxious.      VITALS:  Blood pressure (!) 144/78, pulse 66, temperature 98 F (36.7 C), temperature source Oral, resp. rate 18, height _0  (1.93 m), weight 292 lb 4.8 oz  (132.6 kg), SpO2 95 %.  Wt Readings from Last 3 Encounters:  09/26/22 292 lb 4.8 oz (132.6 kg)  06/29/22 298 lb 4.8 oz (135.3 kg)  06/19/22 295 lb 4.8 oz (133.9 kg)    Body mass index is 35.58 kg/m.  Performance status (ECOG): 0 - Asymptomatic  PHYSICAL EXAM:  Physical Exam Vitals and nursing note reviewed.  Constitutional:      General: He is not in acute distress.    Appearance: Normal appearance. He is normal weight.  HENT:     Head: Normocephalic and atraumatic.     Mouth/Throat:     Mouth: Mucous membranes are moist.     Pharynx: Oropharynx is clear. No oropharyngeal exudate or posterior oropharyngeal erythema.  Eyes:     General: No scleral icterus.    Extraocular Movements: Extraocular movements intact.     Conjunctiva/sclera: Conjunctivae normal.     Pupils: Pupils are equal, round, and reactive to light.  Cardiovascular:     Rate and Rhythm: Normal rate and regular rhythm.     Heart sounds: Normal heart sounds. No murmur heard.    No friction rub. No gallop.  Pulmonary:     Effort: Pulmonary effort is normal.     Breath sounds: Normal breath sounds. No wheezing, rhonchi or rales.  Abdominal:     General: Bowel sounds are normal. There is no distension.     Palpations: Abdomen is soft. There is no hepatomegaly, splenomegaly or mass.     Tenderness: There is no abdominal tenderness.  Musculoskeletal:        General: Normal range of motion.     Cervical back: Normal range of motion and neck supple. No tenderness.     Right lower leg: No edema.     Left lower leg: No edema.  Lymphadenopathy:     Cervical: No cervical adenopathy.     Upper Body:     Right upper body: No supraclavicular or axillary adenopathy.     Left upper body: No supraclavicular or axillary adenopathy.     Lower Body: No right inguinal adenopathy. No left inguinal adenopathy.  Skin:    General: Skin is warm and dry.     Coloration: Skin is not jaundiced.     Findings: No rash.   Neurological:     Mental Status: He is alert and oriented to person, place, and time.     Cranial Nerves: No cranial nerve deficit.  Psychiatric:        Mood and Affect: Mood normal.        Behavior: Behavior normal.        Thought Content: Thought content normal.     LABS:      Latest Ref Rng & Units 09/26/2022    8:36 AM 06/29/2022   12:00 AM 06/19/2022   12:00 AM  CBC  WBC 4.0 - 10.5 K/uL 6.0  11.0     9.8      Hemoglobin 13.0 - 17.0 g/dL 13.7  13.1     13.4      Hematocrit 39.0 - 52.0 % 41.2  38     40  Platelets 150 - 400 K/uL 233  268     235         This result is from an external source.      Latest Ref Rng & Units 06/19/2022   12:00 AM 03/14/2022    3:32 AM 03/02/2022    9:27 AM  CMP  Glucose 70 - 99 mg/dL  186  140   BUN 4 - _0 Creatinine 0.6 - 1.3 0.6     0.65  0.72   Sodium 137 - 147 135     135  137   Potassium 3.5 - 5.1 mEq/L 4.5     4.1  4.9   Chloride 99 - 108 102     104  106   CO2 13 - _1 Calcium 8.7 - 10.7 9.3     8.4  9.4   Total Protein 6.5 - 8.1 g/dL   7.6   Total Bilirubin 0.3 - 1.2 mg/dL   2.5   Alkaline Phos 25 - 125 107      82   AST 14 - 40 33      40   ALT 10 - 40 U/L 41      23      This result is from an external source.     No results found for: "CEA1", "CEA" / No results found for: "CEA1", "CEA" No results found for: "PSA1" No results found for: "CAN199" No results found for: "CAN125"  Lab Results  Component Value Date   TOTALPROTELP 7.1 06/19/2022   ALBUMINELP 3.8 06/19/2022   A1GS 0.2 06/19/2022   A2GS 0.9 06/19/2022   BETS 1.1 06/19/2022   GAMS 1.2 06/19/2022   MSPIKE Not Observed 06/19/2022   SPEI Comment 06/19/2022   Lab Results  Component Value Date   FERRITIN 76 06/19/2022   Lab Results  Component Value Date   LDH 136 06/19/2022    STUDIES:  VAS [place] ABI WITH/WO TBI  Result Date: 09/18/2022  [place] STUDY Patient Name:  LILBURN STRAW  Date of Exam:    09/18/2022 Medical Rec #: [id number]        Accession #:    [id number] Date of Birth: September 22, 1946        Patient Gender: M Patient Age:   76 years Exam Location:   Procedure:      VAS [place] ABI WITH/WO TBI Referring Phys: Sheppard Coil STANDIFORD --------------------------------------------------------------------------------  Indications: Ulcer of toe of left foot, with fat layer exposed ([place]) [W29.562              (ICD-10-CM)]; Cellulitis of foot, left [L03.116 (ICD-10-CM)] High Risk Factors: Hyperlipidemia. Other Factors: Atypical chest pain, Chronic pain of both knees.  Comparison Study: No prior exam for comparison. Performing Technologist: Reel, Jimmy RDCS, RVT  Examination Guidelines: A complete evaluation includes at minimum, Doppler waveform signals and systolic blood pressure reading at the level of bilateral brachial, anterior tibial, and posterior tibial arteries, when vessel segments are accessible. Bilateral testing is considered an integral part of a complete examination. Photoelectric Plethysmograph (PPG) waveforms and toe systolic pressure readings are included as required and additional duplex testing as needed. Limited examinations for reoccurring indications may be performed as noted.  ABI Findings: +---------+------------------+-----+---------+--------+ Right    Rt Pressure (mmHg)IndexWaveform Comment  +---------+------------------+-----+---------+--------+ Brachial 154                                      +---------+------------------+-----+---------+--------+  CFA                             triphasic         +---------+------------------+-----+---------+--------+ Popliteal                       triphasic         +---------+------------------+-----+---------+--------+ PTA      149               0.95 triphasic         +---------+------------------+-----+---------+--------+ DP       170               1.08 triphasic          +---------+------------------+-----+---------+--------+ Great Toe126               0.80 Normal            +---------+------------------+-----+---------+--------+ +---------+------------------+-----+---------+-------+ Left     Lt Pressure (mmHg)IndexWaveform Comment +---------+------------------+-----+---------+-------+ Brachial 157                                     +---------+------------------+-----+---------+-------+ CFA                             triphasic        +---------+------------------+-----+---------+-------+ Popliteal                       triphasic        +---------+------------------+-----+---------+-------+ PTA      175               1.11 triphasic        +---------+------------------+-----+---------+-------+ DP       175               1.11 triphasic        +---------+------------------+-----+---------+-------+ Great Toe122               0.78 Normal           +---------+------------------+-----+---------+-------+ +-------+-----------+-----------+------------+------------+ ABI/TBIToday's ABIToday's TBIPrevious ABIPrevious TBI +-------+-----------+-----------+------------+------------+ Right  1.08       .80                                 +-------+-----------+-----------+------------+------------+ Left   1.11       .78                                 +-------+-----------+-----------+------------+------------+   Summary: Right: Resting right ankle-brachial index is within normal range. The right toe-brachial index is normal. Left: Resting left ankle-brachial index is within normal range. The left toe-brachial index is normal. *See table(s) above for measurements and observations.  Electronically signed by Shirlee More MD on 09/18/2022 at 5:50:57 PM.    Final       HISTORY:   Past Medical History:  Diagnosis Date   Anemia 06/19/2022   Barrett esophagus    Barrett's esophagus    GERD (gastroesophageal reflux disease)    History  of bronchitis    Leukocytosis 06/29/2022   Osteoarthritis of left knee    Severe    Past Surgical History:  Procedure Laterality Date   BACK  SURGERY  2003   L4-L5   COLONOSCOPY     ESOPHAGOGASTRODUODENOSCOPY     HIP SURGERY Left    TOTAL HIP ARTHROPLASTY Left 03/13/2022   Procedure: TOTAL HIP ARTHROPLASTY ANTERIOR APPROACH;  Surgeon: Paralee Cancel, MD;  Location: [facility];  Service: Orthopedics;  Laterality: Left;   TOTAL KNEE ARTHROPLASTY Left 12/09/2017   Procedure: TOTAL KNEE ARTHROPLASTY;  Surgeon: Vickey Huger, MD;  Location: [place];  Service: Orthopedics;  Laterality: Left;    Family History  Problem Relation Age of Onset   Breast cancer Mother    Barrett's esophagus Brother    Esophageal cancer Brother     Social History:  reports that he quit smoking about 20 years ago. His smoking use included cigarettes. He has a 45.00 pack-year smoking history. He has never used smokeless tobacco. He reports that he does not drink alcohol and does not use drugs.The patient is alone today.  Allergies: No Known Allergies  Current Medications: Current Outpatient Medications  Medication Sig Dispense Refill   JARDIANCE 10 MG TABS tablet Take 10 mg by mouth daily.     fluconazole (DIFLUCAN) 150 MG tablet Take 1 tablet (150 mg total) by mouth every 7 (seven) days for 4 doses. (Patient not taking: Reported on 09/26/2022)     HYDROcodone-acetaminophen (NORCO/VICODIN) 5-325 MG tablet Take 1 tablet every 6-8 hours by oral route as needed for 5 days. (Patient not taking: Reported on 09/26/2022)     methylPREDNISolone (MEDROL DOSEPAK) 4 MG TBPK tablet TAKE 6 TABLETS BY MOUTH ON DAY 1, 5 TABLETS ON DAY 2, 4 TABLETS ON DAY 3, 3 TABLETS ON DAY 4, 2 TABLETS ON DAY 5, AND 1 TABLET ON DAY 6. (Patient not taking: Reported on 09/26/2022)     pantoprazole (PROTONIX) 40 MG tablet Take 40 mg by mouth daily.     predniSONE (DELTASONE) 5 MG tablet Take 5 mg by mouth 3 (three) times daily.     predniSONE (DELTASONE)  5 MG tablet 3 tablets Orally Once a day and taper as directed for 30 days (Patient not taking: Reported on 09/26/2022)     No current facility-administered medications for this visit.

## 2022-09-26 NOTE — Telephone Encounter (Signed)
09/26/22 NEXT APPT SCHEDULED AND CONFIRMED WITH PATIENT 

## 2022-09-26 NOTE — Assessment & Plan Note (Signed)
The leukocytosis has resolved.  There was one immature granulocyte seen on peripheral smear, so we will continue to monitor him.

## 2022-09-27 ENCOUNTER — Telehealth: Payer: Self-pay

## 2022-09-27 NOTE — Telephone Encounter (Signed)
-----   Message from Adah Perl, PA-C sent at 09/26/2022 11:38 AM EST ----- Please let him know his white blood cell count and hemoglobin are normal. Keep f/u in 3 months. Thanks

## 2022-09-27 NOTE — Telephone Encounter (Signed)
Patient notfied and voiced understanding.

## 2022-09-28 ENCOUNTER — Other Ambulatory Visit: Payer: PPO

## 2022-09-28 ENCOUNTER — Ambulatory Visit: Payer: PPO | Admitting: Hematology and Oncology

## 2022-10-09 DIAGNOSIS — M5136 Other intervertebral disc degeneration, lumbar region: Secondary | ICD-10-CM | POA: Diagnosis not present

## 2022-10-09 DIAGNOSIS — M1991 Primary osteoarthritis, unspecified site: Secondary | ICD-10-CM | POA: Diagnosis not present

## 2022-10-09 DIAGNOSIS — Z7952 Long term (current) use of systemic steroids: Secondary | ICD-10-CM | POA: Diagnosis not present

## 2022-10-09 DIAGNOSIS — E669 Obesity, unspecified: Secondary | ICD-10-CM | POA: Diagnosis not present

## 2022-10-09 DIAGNOSIS — M353 Polymyalgia rheumatica: Secondary | ICD-10-CM | POA: Diagnosis not present

## 2022-10-09 DIAGNOSIS — Z6836 Body mass index (BMI) 36.0-36.9, adult: Secondary | ICD-10-CM | POA: Diagnosis not present

## 2022-10-19 DIAGNOSIS — D510 Vitamin B12 deficiency anemia due to intrinsic factor deficiency: Secondary | ICD-10-CM | POA: Diagnosis not present

## 2022-10-20 IMAGING — XA DG HIP (WITH OR WITHOUT PELVIS) 1V*L*
1 series · 2 of 2 positions shown · non-contrast
Comparison: None.

CLINICAL DATA: Left total hip arthroplasty.

EXAM:
DG HIP (WITH OR WITHOUT PELVIS) 1V*L*

[Series 1: unknown protocol · 2 of 2 slices shown]
[im 1/2]
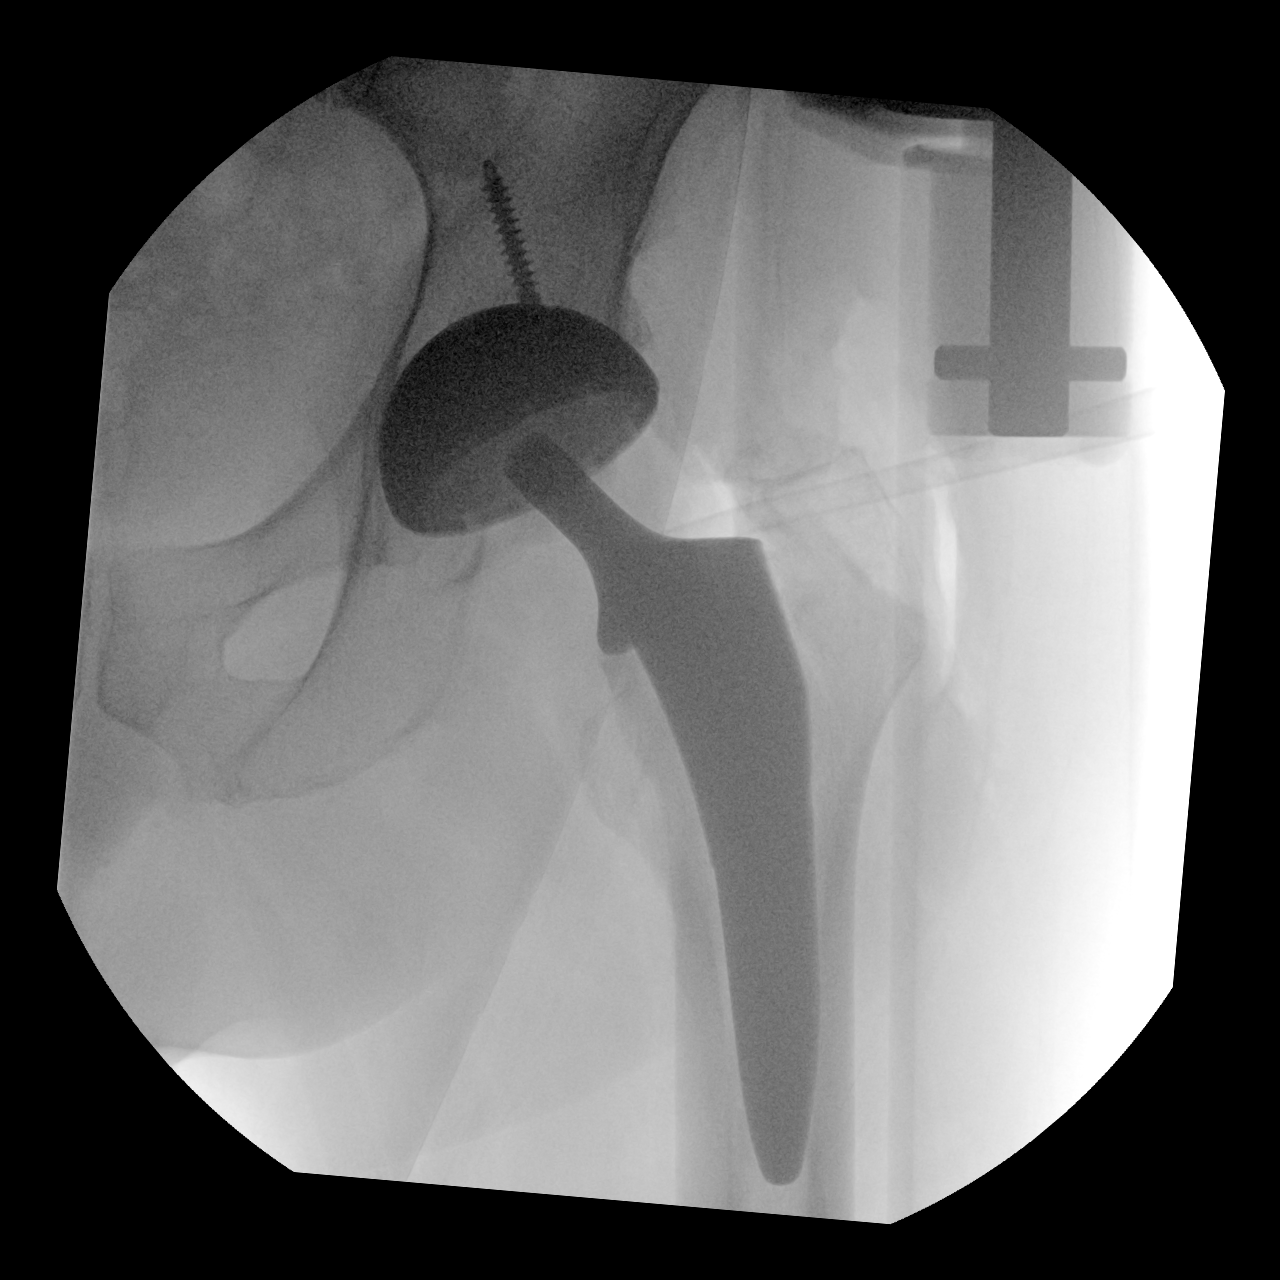
[im 2/2]
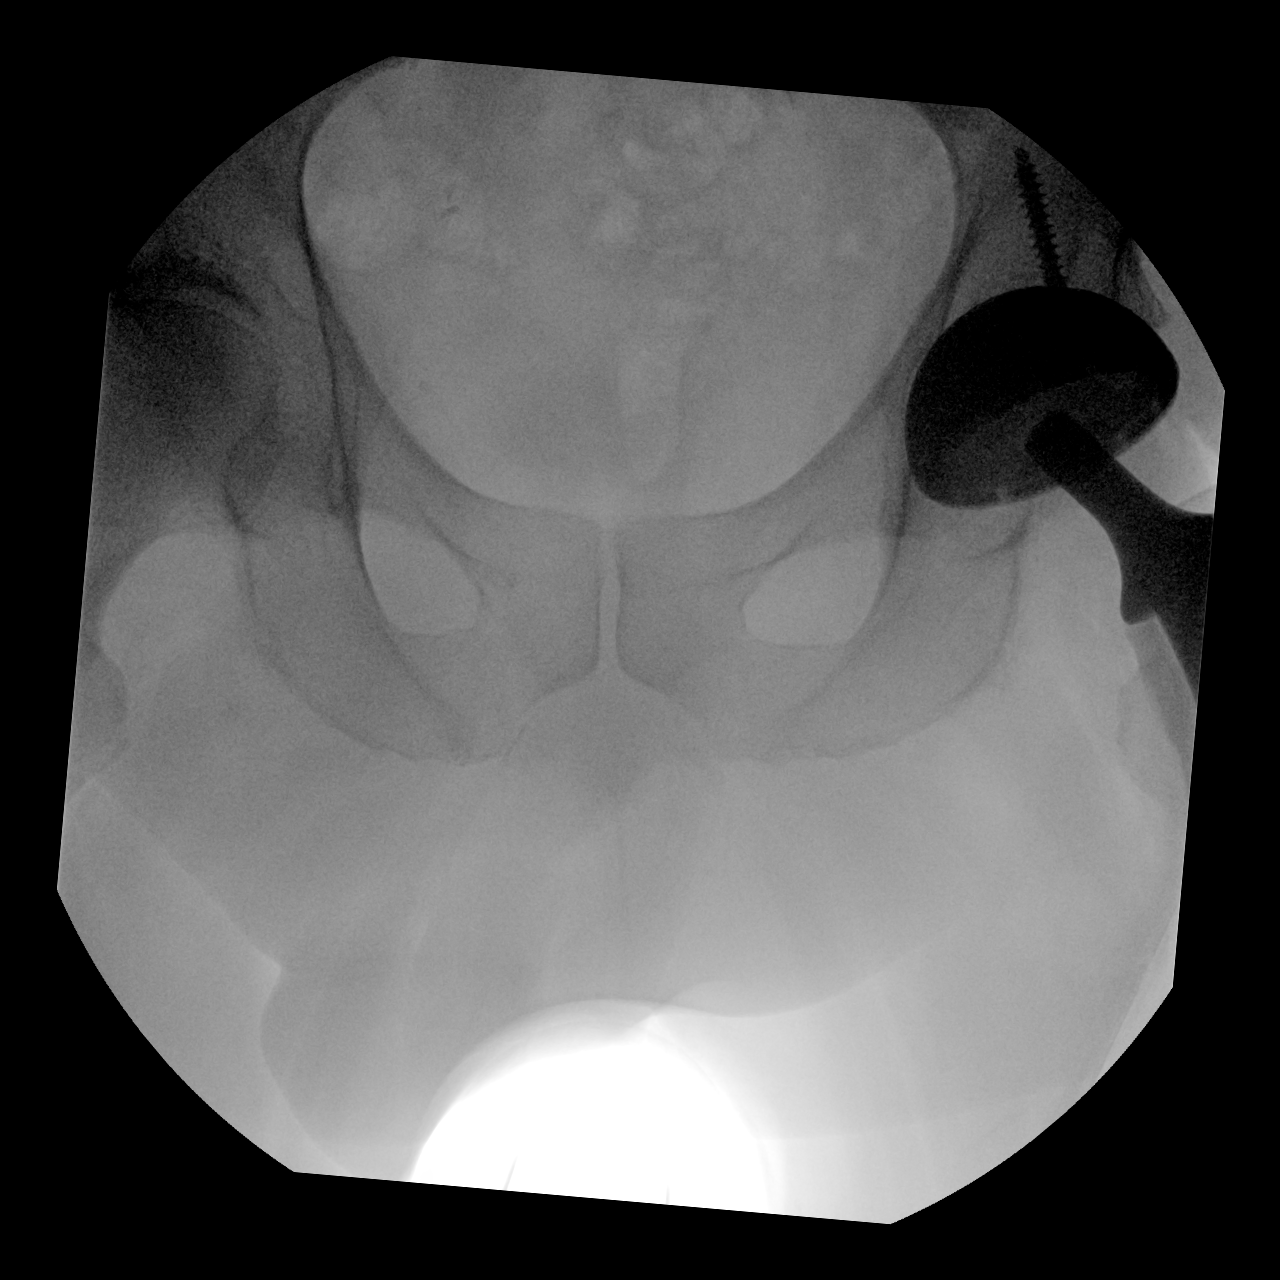

[2 of 2 positions shown; findings below may reference images not displayed]

FINDINGS: C-arm fluoroscopy was provided in the operating [HOSPITAL] seconds of
fluoroscopy time. 5.1 mGy.

Spot AP images of the lower pelvis and left hip demonstrate the
placement of a screw fixed left acetabular component. Femoral
prosthesis is in place. The femoral head component has not yet been
placed. No evidence of acute fracture or dislocation. There is gas
in the soft tissues surrounding the hip.
IMPRESSION: Intraoperative views during left total hip arthroplasty. No
demonstrated complications.

## 2022-10-20 IMAGING — DX DG PORTABLE PELVIS
1 series · 1 of 1 positions shown · non-contrast
Comparison: None available

CLINICAL DATA: Postoperative. Status post total left hip
arthroplasty.

EXAM:
PORTABLE PELVIS 1-2 VIEWS

[pelvis ap]
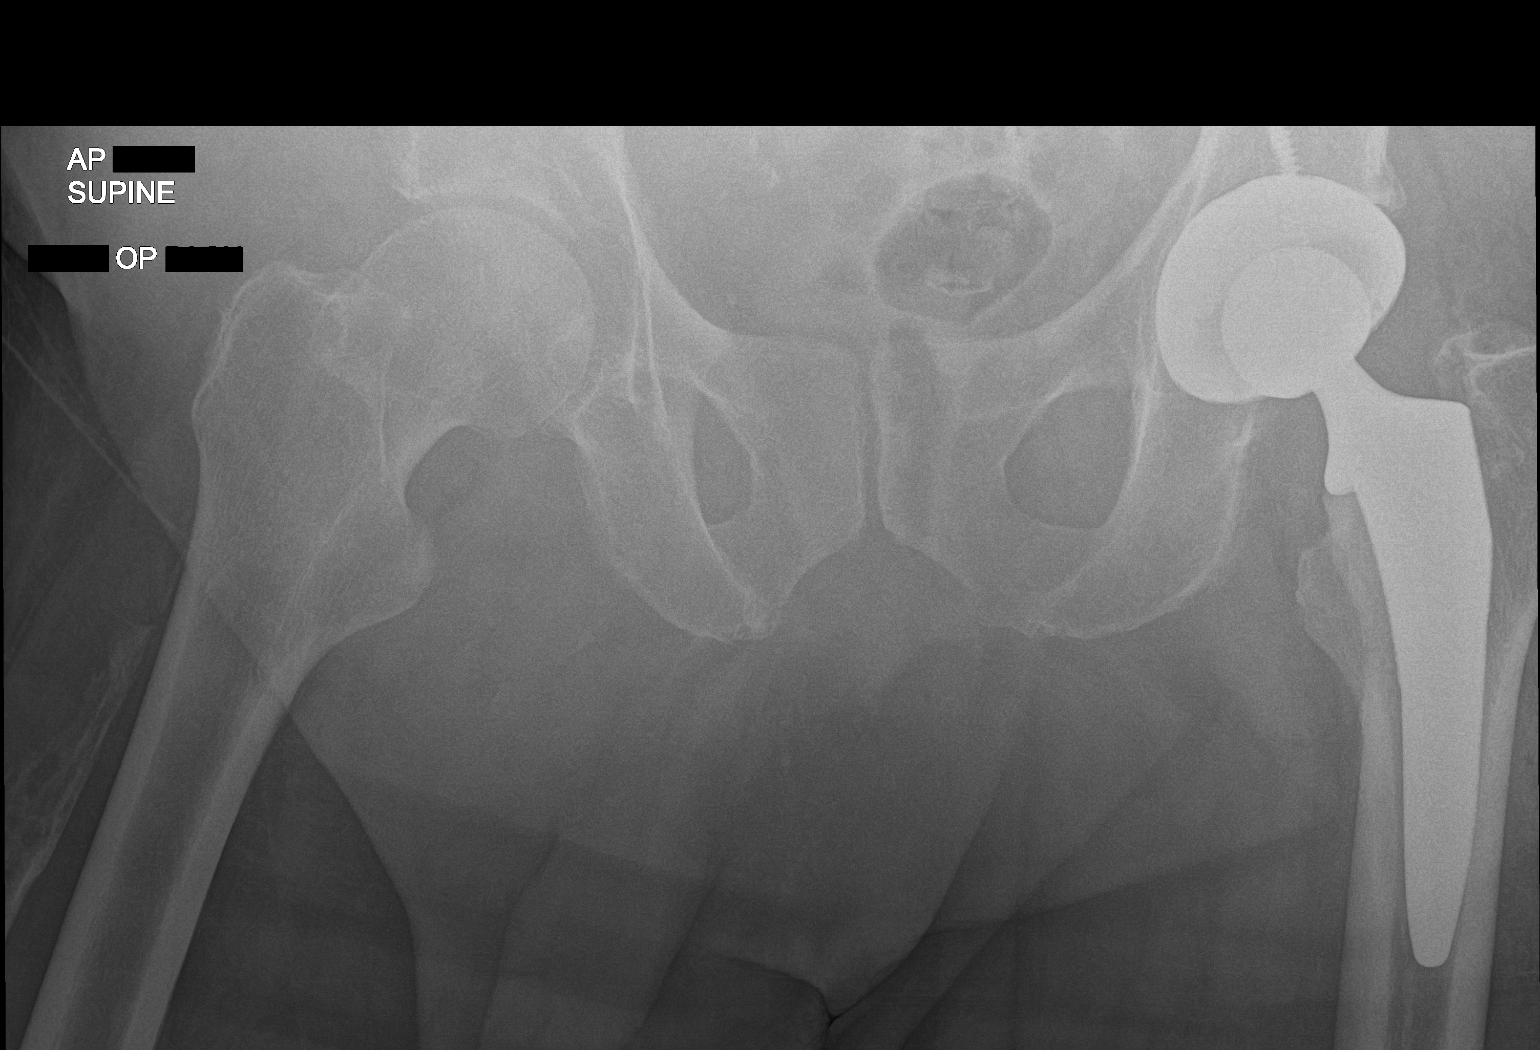

[1 of 1 positions shown; findings below may reference images not displayed]

FINDINGS: Single frontal view of the bilateral hips. Status post total left
hip arthroplasty. No perihardware lucency is seen to indicate
hardware failure or loosening. Mild superior left hip soft tissue
air, normal for recent surgery. Mild right femoroacetabular joint
space narrowing. No acute fracture or dislocation.
IMPRESSION: Status post total left hip arthroplasty without evidence of hardware
failure on frontal view.

## 2022-11-22 ENCOUNTER — Ambulatory Visit: Payer: PPO | Admitting: Podiatry

## 2022-11-22 ENCOUNTER — Encounter: Payer: Self-pay | Admitting: Podiatry

## 2022-11-22 VITALS — BP 148/96

## 2022-11-22 DIAGNOSIS — L84 Corns and callosities: Secondary | ICD-10-CM

## 2022-11-22 DIAGNOSIS — M5136 Other intervertebral disc degeneration, lumbar region: Secondary | ICD-10-CM | POA: Insufficient documentation

## 2022-11-22 DIAGNOSIS — B351 Tinea unguium: Secondary | ICD-10-CM | POA: Diagnosis not present

## 2022-11-22 DIAGNOSIS — E669 Obesity, unspecified: Secondary | ICD-10-CM | POA: Insufficient documentation

## 2022-11-22 DIAGNOSIS — G5793 Unspecified mononeuropathy of bilateral lower limbs: Secondary | ICD-10-CM

## 2022-11-22 DIAGNOSIS — M79674 Pain in right toe(s): Secondary | ICD-10-CM

## 2022-11-22 DIAGNOSIS — I739 Peripheral vascular disease, unspecified: Secondary | ICD-10-CM

## 2022-11-22 DIAGNOSIS — M79675 Pain in left toe(s): Secondary | ICD-10-CM | POA: Diagnosis not present

## 2022-11-22 DIAGNOSIS — D510 Vitamin B12 deficiency anemia due to intrinsic factor deficiency: Secondary | ICD-10-CM

## 2022-11-22 DIAGNOSIS — Z6836 Body mass index (BMI) 36.0-36.9, adult: Secondary | ICD-10-CM | POA: Insufficient documentation

## 2022-11-22 NOTE — Progress Notes (Signed)
  Subjective:  Patient ID: Nicholas Rowland, male    DOB: [birth year],  MRN: [id number]  Nicholas Rowland presents to clinic today for preulcerative lesion(s) right lower extremity and painful mycotic toenails that limit ambulation. Painful toenails interfere with ambulation. Aggravating factors include wearing enclosed shoe gear. Pain is relieved with periodic professional debridement. Painful porokeratotic lesions are aggravated when weightbearing with and without shoegear. Pain is relieved with periodic professional debridement.  Chief Complaint  Patient presents with   Nail Problem    DFC BG - pt states it has been a couple of days since he last checked A1C - pt does not recall PCP - Dr Lin Landsman, last OV 09/2022   New problem(s): None.   PCP is Nicholas Sheriff, MD.  No Known Allergies  Review of Systems: Negative except as noted in the HPI.  Objective:  Vitals:   11/22/22 0924  BP: (!) 148/96   Nicholas Rowland is a pleasant 77 y.o. male morbidly obese in NAD. AAO x 3.  Vascular Examination:  Dorsalis Pedis artery and Posterior Tibial artery pedal pulses are 2/4 bilateral with immediate capillary fill time. Pedal hair growth present. No varicosities and no lower extremity edema present bilateral.   Dermatological:  Skin is warm, dry and supple bilateral. Nails x 10 are well manicured; remaining integument appears unremarkable at this time.   Porokeratotic lesion(s) submet head 1 left and submet head 5 right foot. No erythema, no edema, no drainage, no fluctuance.   Preulcerative lesion noted distal tip of left 2nd toe and submet head 2 right foot. There is visible subdermal hemorrhage. There is no surrounding erythema, no edema, no drainage, no odor, no fluctuance.   Neurological Examination: Pt has subjective symptoms of neuropathy. Grossly intact via light touch bilateral. Vibratory intact via tuning fork bilateral. Protective threshold with Semmes Weinstein monofilament  intact to all pedal sites bilateral.   Musculoskeletal Examination:  Normal muscle strength 5/5 to all lower extremity muscle groups bilaterally. Hammertoe deformity noted 2-5 b/l.  No pain, crepitus or joint limitation noted with ROM b/l LE.  Patient ambulates independently without assistive aids.  Assessment/Plan: 1. Pain due to onychomycosis of toenails of both feet   2. Pre-ulcerative corn or callous   3. Pernicious anemia   4. Neuropathy of both feet   5. PAD (peripheral artery disease) ([place])     No orders of the defined types were placed in this encounter.   -Patient was evaluated and treated. All patient's and/or POA's questions/concerns answered on today's visit. -Discussed extra depth shoes and custom insoles. He declines on today's visit. -Continue supportive shoe gear daily. -Toenails 1-5 b/l were debrided in length and girth with sterile nail nippers and dremel without iatrogenic bleeding.  -Callus(es) submet head 1 left foot and submet head 5 right foot pared utilizing sterile scalpel blade without complication or incident. Total number debrided =3. -Preulcerative lesion pared distal tip of right 2nd toe and submet head 2 right foot utilizing sterile scalpel blade. Total number pared=1. -Patient/POA to call should there be question/concern in the interim.   Return in about 3 months (around 02/21/2023).  Marzetta Board, DPM

## 2022-12-27 ENCOUNTER — Other Ambulatory Visit: Payer: PPO

## 2022-12-27 ENCOUNTER — Other Ambulatory Visit: Payer: Self-pay | Admitting: Hematology and Oncology

## 2022-12-27 ENCOUNTER — Ambulatory Visit: Payer: PPO | Admitting: Hematology and Oncology

## 2022-12-27 DIAGNOSIS — D649 Anemia, unspecified: Secondary | ICD-10-CM

## 2023-01-01 ENCOUNTER — Encounter: Payer: Self-pay | Admitting: Hematology and Oncology

## 2023-01-01 ENCOUNTER — Telehealth: Payer: Self-pay

## 2023-01-01 ENCOUNTER — Inpatient Hospital Stay (INDEPENDENT_AMBULATORY_CARE_PROVIDER_SITE_OTHER): Payer: PPO | Admitting: Hematology and Oncology

## 2023-01-01 ENCOUNTER — Inpatient Hospital Stay: Payer: PPO | Attending: Hematology and Oncology

## 2023-01-01 VITALS — BP 136/85 | HR 75 | Temp 98.7°F | Resp 20 | Ht 76.0 in | Wt 298.6 lb

## 2023-01-01 DIAGNOSIS — Z79899 Other long term (current) drug therapy: Secondary | ICD-10-CM | POA: Insufficient documentation

## 2023-01-01 DIAGNOSIS — D72829 Elevated white blood cell count, unspecified: Secondary | ICD-10-CM | POA: Insufficient documentation

## 2023-01-01 DIAGNOSIS — Z7952 Long term (current) use of systemic steroids: Secondary | ICD-10-CM | POA: Insufficient documentation

## 2023-01-01 DIAGNOSIS — D649 Anemia, unspecified: Secondary | ICD-10-CM

## 2023-01-01 LAB — CMP (CANCER CENTER ONLY)
ALT: 25 U/L (ref 0–44)
AST: 27 U/L (ref 15–41)
Albumin: 4.3 g/dL (ref 3.5–5.0)
Alkaline Phosphatase: 53 U/L (ref 38–126)
Anion gap: 10 (ref 5–15)
BUN: 14 mg/dL (ref 8–23)
CO2: 22 mmol/L (ref 22–32)
Calcium: 8.8 mg/dL — ABNORMAL LOW (ref 8.9–10.3)
Chloride: 105 mmol/L (ref 98–111)
Creatinine: 0.85 mg/dL (ref 0.61–1.24)
GFR, Estimated: >60 mL/min (ref >60)
Glucose, Bld: 186 mg/dL — ABNORMAL HIGH (ref 70–99)
Potassium: 3.9 mmol/L (ref 3.5–5.1)
Sodium: 137 mmol/L (ref 135–145)
Total Bilirubin: 0.8 mg/dL (ref 0.3–1.2)
Total Protein: 7.3 g/dL (ref 6.5–8.1)

## 2023-01-01 LAB — CBC WITH DIFFERENTIAL (CANCER CENTER ONLY)
Abs Immature Granulocytes: 0.02 10*3/uL (ref 0.00–0.07)
Basophils Absolute: 0.1 10*3/uL (ref 0.0–0.1)
Basophils Relative: 1 %
Eosinophils Absolute: 0.1 10*3/uL (ref 0.0–0.5)
Eosinophils Relative: 1 %
HCT: 43.5 % (ref 39.0–52.0)
Hemoglobin: 14.3 g/dL (ref 13.0–17.0)
Immature Granulocytes: 0 %
Lymphocytes Relative: 20 %
Lymphs Abs: 1.4 10*3/uL (ref 0.7–4.0)
MCH: 31.2 pg (ref 26.0–34.0)
MCHC: 32.9 g/dL (ref 30.0–36.0)
MCV: 95 fL (ref 80.0–100.0)
Monocytes Absolute: 0.7 10*3/uL (ref 0.1–1.0)
Monocytes Relative: 9 %
Neutro Abs: 4.8 10*3/uL (ref 1.7–7.7)
Neutrophils Relative %: 69 %
Platelet Count: 202 10*3/uL (ref 150–400)
RBC: 4.58 MIL/uL (ref 4.22–5.81)
RDW: 13.3 % (ref 11.5–15.5)
WBC Count: 7.1 10*3/uL (ref 4.0–10.5)
nRBC: 0 % (ref 0.0–0.2)

## 2023-01-01 NOTE — Assessment & Plan Note (Addendum)
Leukocytosis with occasional immature granulocyte.  This was most likely secondary to prednisone.  Prednisone is being tapered.  The leukocytosis has resolved and there are no abnormal cells in differential today.

## 2023-01-01 NOTE — Telephone Encounter (Signed)
-----   Message from Marvia Pickles, PA-C sent at 01/01/2023  3:33 PM EST ----- Please let him know his blood counts are normal, so he can f/u with Dr. Lin Landsman as planned. Thanks

## 2023-01-01 NOTE — Assessment & Plan Note (Addendum)
Normochromic normocytic anemia, for which no specific etiology was found.  There was no evidence of nutritional deficiency, thyroid disease, hemolysis or multiple myeloma contributing to his anemia.  The anemia has resolved.

## 2023-01-01 NOTE — Progress Notes (Signed)
[facility]  [address] [place],  [place]  [address] [phone]  Clinic Day:  01/01/2023  Referring physician: Angelina Sheriff, MD  ASSESSMENT & PLAN:   Assessment & Plan: Anemia Normochromic normocytic anemia, for which no specific etiology was found.  There was no evidence of nutritional deficiency, thyroid disease, hemolysis or multiple myeloma contributing to his anemia.  The anemia has resolved.   Leukocytosis Leukocytosis with occasional immature granulocyte.  This was most likely secondary to prednisone.  Prednisone is being tapered.  The leukocytosis has resolved and there are no abnormal cells in differential today.   As his white blood cell count and hemoglobin remained normal, I feel comfortable turning his care back over to Dr. Lin Landsman.  I am glad to see the patient back in the future as needed.  The patient understands the plans discussed today and is in agreement with them.   I provided 10 (minutes of face-to-face time during this encounter and > 50% was spent counseling as documented under my assessment and plan.    Marvia Pickles, PA-C  [facility] AT [facility] [address] [place] [address] Dept: [phone] Dept Fax: [phone]   No orders of the defined types were placed in this encounter.     CHIEF COMPLAINT:  CC: Anemia, leukocytosis  Current Treatment:  Surveillance  HISTORY OF PRESENT ILLNESS:  Nicholas Rowland is a 77 y.o. male with normochromic normocytic anemia who I began seeing in August.  The patient has had borderline anemia since October 2022, at which time his hemoglobin was 13.8 with an MCV of 91, normal white blood cell count and platelets.  At a routine visit on July 11, his hemoglobin was 12.7 with an MCV of 88, white blood cell count 7.6 with a normal differential and platelets 260,000.  The patient states he has not previously been told he  was anemic.  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  Preoperatively his hemoglobin was 14.4, postoperatively his hemoglobin was 12.7. He previously donated blood for regularly but had not done so recently. 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  He stated he had an EGD and colonoscopy in the past year year with Dr. Melina Copa looked good.  Routine follow-up was not recommended.  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    At his last visit, the anemia and leukocytosis had resolved.  He had been started on Jardiance.  He was trying to lose weight to better manage his diabetes.  He had seen Dr. Amil Amen in rheumatology who recommended tapering his prednisone.    INTERVAL HISTORY:  Nicholas Rowland is here today for repeat clinical assessment.  He denies fatigue He denies fevers, chills or night sweats.  He denies recent infection.Marland Kitchen He denies pain. His appetite is good. His weight has increased 6 pounds over last 3 months .  Dr. Amil Amen continues to taper his prednisone and he is down to 7 mg daily.  REVIEW OF SYSTEMS:  Review of Systems  Constitutional:  Negative for appetite change, chills, fatigue, fever and unexpected weight change.  HENT:   Negative for lump/mass, mouth sores and sore throat.   Respiratory:  Negative for cough and shortness of breath.   Cardiovascular:  Negative for chest pain and leg swelling.  Gastrointestinal:  Negative for abdominal pain, constipation, diarrhea, nausea and vomiting.  Genitourinary:  Negative for difficulty urinating, dysuria, frequency and hematuria.   Musculoskeletal:  Negative for arthralgias, back pain and myalgias.  Skin:  Negative for itching, rash and wound.  Neurological:  Negative for dizziness, extremity weakness, headaches, light-headedness and numbness.  Hematological:  Negative for adenopathy.  Psychiatric/Behavioral:  Negative for depression and sleep disturbance. The patient is  not nervous/anxious.      VITALS:  Blood pressure 136/85, pulse 75, temperature 98.7 F (37.1 C), temperature source Oral, resp. rate 20, height '6\' 4"'$  (1.93 m), weight 298 lb 9.6 oz (135.4 kg), SpO2 96 %.  Wt Readings from Last 3 Encounters:  01/01/23 298 lb 9.6 oz (135.4 kg)  09/26/22 292 lb 4.8 oz (132.6 kg)  06/29/22 298 lb 4.8 oz (135.3 kg)    Body mass index is 36.35 kg/m.  Performance status (ECOG): 0 - Asymptomatic  PHYSICAL EXAM:  Physical Exam Vitals and nursing note reviewed.  Constitutional:      General: He is not in acute distress.    Appearance: Normal appearance. He is normal weight.  HENT:     Rowland: Normocephalic and atraumatic.     Mouth/Throat:     Mouth: Mucous membranes are moist.     Pharynx: Oropharynx is clear. No oropharyngeal exudate or posterior oropharyngeal erythema.  Eyes:     General: No scleral icterus.    Extraocular Movements: Extraocular movements intact.     Conjunctiva/sclera: Conjunctivae normal.     Pupils: Pupils are equal, round, and reactive to light.  Cardiovascular:     Rate and Rhythm: Normal rate and regular rhythm.     Heart sounds: Normal heart sounds. No murmur heard.    No friction rub. No gallop.  Pulmonary:     Effort: Pulmonary effort is normal.     Breath sounds: Normal breath sounds. No wheezing, rhonchi or rales.  Abdominal:     General: Bowel sounds are normal. There is no distension.     Palpations: Abdomen is soft. There is no hepatomegaly, splenomegaly or mass.     Tenderness: There is no abdominal tenderness.  Musculoskeletal:        General: Normal range of motion.     Cervical back: Normal range of motion and neck supple. No tenderness.     Right lower leg: No edema.     Left lower leg: No edema.  Lymphadenopathy:     Cervical: No cervical adenopathy.     Upper Body:     Right upper body: No supraclavicular or axillary adenopathy.     Left upper body: No supraclavicular or axillary adenopathy.     Lower  Body: No right inguinal adenopathy. No left inguinal adenopathy.  Skin:    General: Skin is warm and dry.     Coloration: Skin is not jaundiced.     Findings: No rash.  Neurological:     Mental Status: He is alert and oriented to person, place, and time.     Cranial Nerves: No cranial nerve deficit.  Psychiatric:        Mood and Affect: Mood normal.        Behavior: Behavior normal.        Thought Content: Thought content normal.    LABS:      Latest Ref Rng & Units 01/01/2023    9:11 AM 09/26/2022    8:36 AM 06/29/2022   12:00 AM  CBC  WBC 4.0 - 10.5 K/uL 7.1  6.0  11.0      Hemoglobin  13.0 - 17.0 g/dL 14.3  13.7  13.1      Hematocrit 39.0 - 52.0 % 43.5  41.2  38      Platelets 150 - 400 K/uL 202  233  268         This result is from an external source.      Latest Ref Rng & Units 01/01/2023    9:11 AM 09/26/2022    8:36 AM 06/19/2022   12:00 AM  CMP  Glucose 70 - 99 mg/dL 186  232    BUN 8 - 23 mg/dL '14  16  21      '$ Creatinine 0.61 - 1.24 mg/dL 0.85  0.67  0.6      Sodium 135 - 145 mmol/L 137  140  135      Potassium 3.5 - 5.1 mmol/L 3.9  4.4  4.5      Chloride 98 - 111 mmol/L 105  105  102      CO2 22 - 32 mmol/L '22  24  24      '$ Calcium 8.9 - 10.3 mg/dL 8.8  9.5  9.3      Total Protein 6.5 - 8.1 g/dL 7.3  7.4    Total Bilirubin 0.3 - 1.2 mg/dL 0.8  0.8    Alkaline Phos 38 - 126 U/L 53  75  107      AST 15 - 41 U/L 27  32  33      ALT 0 - 44 U/L 25  34  41         This result is from an external source.     No results found for: "CEA1", "CEA" / No results found for: "CEA1", "CEA" No results found for: "PSA1" No results found for: "CAN199" No results found for: "CAN125"  Lab Results  Component Value Date   TOTALPROTELP 7.1 06/19/2022   ALBUMINELP 3.8 06/19/2022   A1GS 0.2 06/19/2022   A2GS 0.9 06/19/2022   BETS 1.1 06/19/2022   GAMS 1.2 06/19/2022   MSPIKE Not Observed 06/19/2022   SPEI Comment 06/19/2022   Lab Results  Component Value Date   TIBC 393  09/26/2022   FERRITIN 44 09/26/2022   FERRITIN 76 06/19/2022   IRONPCTSAT 19 09/26/2022   Lab Results  Component Value Date   LDH 156 09/26/2022   LDH 136 06/19/2022    STUDIES:   None   HISTORY:   Past Medical History:  Diagnosis Date   Anemia 06/19/2022   Barrett esophagus    Barrett's esophagus    GERD (gastroesophageal reflux disease)    History of bronchitis    Leukocytosis 06/29/2022   Osteoarthritis of left knee    Severe    Past Surgical History:  Procedure Laterality Date   BACK SURGERY  2003   L4-L5   COLONOSCOPY     ESOPHAGOGASTRODUODENOSCOPY     HIP SURGERY Left    TOTAL HIP ARTHROPLASTY Left 03/13/2022   Procedure: TOTAL HIP ARTHROPLASTY ANTERIOR APPROACH;  Surgeon: Paralee Cancel, MD;  Location: [facility];  Service: Orthopedics;  Laterality: Left;   TOTAL KNEE ARTHROPLASTY Left 12/09/2017   Procedure: TOTAL KNEE ARTHROPLASTY;  Surgeon: Vickey Huger, MD;  Location: [facility];  Service: Orthopedics;  Laterality: Left;    Family History  Problem Relation Age of Onset   Breast cancer Mother    Barrett's esophagus Brother    Esophageal cancer Brother     Social History:  reports that he quit smoking about  21 years ago. His smoking use included cigarettes. He has a 45.00 pack-year smoking history. He has never used smokeless tobacco. He reports that he does not drink alcohol and does not use drugs.The patient is alone today.  Today.  Allergies: No Known Allergies  Current Medications: Current Outpatient Medications  Medication Sig Dispense Refill   predniSONE (DELTASONE) 1 MG tablet Take 1 mg by mouth daily. Take two tablets along with '5mg'$  tablet for total of '7mg'$  daily per patient.     JARDIANCE 10 MG TABS tablet Take 10 mg by mouth daily.     pantoprazole (PROTONIX) 40 MG tablet Take 40 mg by mouth daily.     predniSONE (DELTASONE) 5 MG tablet      No current facility-administered medications for this visit.

## 2023-01-01 NOTE — Telephone Encounter (Signed)
Message left for patient   

## 2023-02-05 DIAGNOSIS — M1991 Primary osteoarthritis, unspecified site: Secondary | ICD-10-CM | POA: Diagnosis not present

## 2023-02-05 DIAGNOSIS — Z6835 Body mass index (BMI) 35.0-35.9, adult: Secondary | ICD-10-CM | POA: Diagnosis not present

## 2023-02-05 DIAGNOSIS — E669 Obesity, unspecified: Secondary | ICD-10-CM | POA: Diagnosis not present

## 2023-02-05 DIAGNOSIS — M5136 Other intervertebral disc degeneration, lumbar region: Secondary | ICD-10-CM | POA: Diagnosis not present

## 2023-02-05 DIAGNOSIS — R21 Rash and other nonspecific skin eruption: Secondary | ICD-10-CM | POA: Diagnosis not present

## 2023-02-05 DIAGNOSIS — Z7952 Long term (current) use of systemic steroids: Secondary | ICD-10-CM | POA: Diagnosis not present

## 2023-02-05 DIAGNOSIS — M353 Polymyalgia rheumatica: Secondary | ICD-10-CM | POA: Diagnosis not present

## 2023-02-21 ENCOUNTER — Ambulatory Visit: Payer: PPO | Admitting: Podiatry

## 2023-02-21 DIAGNOSIS — M79674 Pain in right toe(s): Secondary | ICD-10-CM

## 2023-02-21 DIAGNOSIS — I739 Peripheral vascular disease, unspecified: Secondary | ICD-10-CM | POA: Diagnosis not present

## 2023-02-21 DIAGNOSIS — D510 Vitamin B12 deficiency anemia due to intrinsic factor deficiency: Secondary | ICD-10-CM | POA: Diagnosis not present

## 2023-02-21 DIAGNOSIS — M79675 Pain in left toe(s): Secondary | ICD-10-CM | POA: Diagnosis not present

## 2023-02-21 DIAGNOSIS — B351 Tinea unguium: Secondary | ICD-10-CM

## 2023-02-21 DIAGNOSIS — L84 Corns and callosities: Secondary | ICD-10-CM | POA: Diagnosis not present

## 2023-02-21 DIAGNOSIS — G5793 Unspecified mononeuropathy of bilateral lower limbs: Secondary | ICD-10-CM

## 2023-02-24 ENCOUNTER — Encounter: Payer: Self-pay | Admitting: Podiatry

## 2023-02-24 NOTE — Progress Notes (Signed)
  Subjective:  Patient ID: Nicholas Rowland, male    DOB: April 24, 1946,  MRN: [id number]  CHUCK ALAMILLO presents to clinic today for at risk foot care. Patient has h/o PAD and neuropathy and preulcerative lesion(s) right foot and painful mycotic toenails that limit ambulation. Painful toenails interfere with ambulation. Aggravating factors include wearing enclosed shoe gear. Pain is relieved with periodic professional debridement. Painful porokeratotic lesions are aggravated when weightbearing with and without shoegear. Pain is relieved with periodic professional debridement.  Chief Complaint  Patient presents with   routine foot care    New problem(s): None.   PCP is Noni Saupe, MD.  No Known Allergies  Review of Systems: Negative except as noted in the HPI.  Objective:  There were no vitals filed for this visit.  Nicholas Rowland is a pleasant 77 y.o. male morbidly obese in NAD. AAO x 3.  Vascular Examination:  Dorsalis Pedis artery and Posterior Tibial artery pedal pulses are 2/4 bilateral with immediate capillary fill time. Pedal hair growth present. No varicosities and no lower extremity edema present bilateral.   Dermatological:  Skin is warm, dry and supple bilateral. Nails x 10 are well manicured; remaining integument appears unremarkable at this time.   Preulcerative lesion noted submet head 2 right foot. There is visible subdermal hemorrhage. There is no surrounding erythema, no edema, no drainage, no odor, no fluctuance.   Neurological Examination: Pt has subjective symptoms of neuropathy. Grossly intact via light touch bilateral. Vibratory intact via tuning fork bilateral. Protective threshold with Semmes Weinstein monofilament intact to all pedal sites bilateral.   Musculoskeletal Examination:  Normal muscle strength 5/5 to all lower extremity muscle groups bilaterally. Hammertoe deformity noted 2-5 b/l.  No pain, crepitus or joint limitation noted with ROM b/l LE.   Patient ambulates independently without assistive aids.  Assessment/Plan: 1. Pain due to onychomycosis of toenails of both feet   2. Pre-ulcerative corn or callous   3. Pernicious anemia   4. Neuropathy of both feet   5. PAD (peripheral artery disease)    -Consent given for treatment as described below: -Examined patient. -Patient to continue soft, supportive shoe gear daily. -Mycotic toenails 1-5 bilaterally were debrided in length and girth with sterile nail nippers and dremel without incident. -Preulcerative lesion pared submet head 2 right foot utilizing sterile scalpel blade. Total number pared=1. -Patient/POA to call should there be question/concern in the interim.   No follow-ups on file.  Freddie Breech, DPM

## 2023-05-01 DIAGNOSIS — E538 Deficiency of other specified B group vitamins: Secondary | ICD-10-CM | POA: Diagnosis not present

## 2023-05-09 ENCOUNTER — Ambulatory Visit: Payer: PPO | Admitting: Podiatry

## 2023-05-22 ENCOUNTER — Ambulatory Visit: Payer: PPO | Admitting: Podiatry

## 2023-05-22 DIAGNOSIS — L84 Corns and callosities: Secondary | ICD-10-CM

## 2023-05-22 DIAGNOSIS — I739 Peripheral vascular disease, unspecified: Secondary | ICD-10-CM

## 2023-05-22 DIAGNOSIS — B351 Tinea unguium: Secondary | ICD-10-CM | POA: Diagnosis not present

## 2023-05-22 NOTE — Progress Notes (Signed)
       Subjective:  Patient ID: Nicholas Rowland, male    DOB: 1946-10-29,  MRN: [id number]  Nicholas Rowland presents to clinic today for:  Chief Complaint  Patient presents with   Nail Problem    Routine Foot Care- Nail trim.   . Patient notes nails are thick and elongated, causing pain in shoe gear when ambulating.  Also has painful calluses mostly on the plantar aspect of the right foot.  PCP is Noni Saupe, MD.  No Known Allergies  Review of Systems: Negative except as noted in the HPI.  Objective:  There were no vitals filed for this visit.  Nicholas Rowland is a pleasant 77 y.o. male in NAD. AAO x 3.  Vascular Examination: Patient has palpable DP pulse, absent PT pulse bilateral.  Delayed capillary refill bilateral toes.  Sparse digital hair bilateral.  Proximal to distal cooling WNL bilateral.    Dermatological Examination: Interspaces are clear with no open lesions noted bilateral.  Nails are 3-67mm thick, with yellowish/brown discoloration, subungual debris and distal onycholysis x10.  There is pain with compression of nails x10.  There are hyperkeratotic lesions noted submet 1, submet 3, submet 5 right foot.  There is a distal toe corn on right third toe.  Neurological Examination: Epicritic sensation intact b/l LE.   Patient qualifies for at-risk foot care because of PVD, pain and nails (Q8).  Assessment/Plan: 1. Dermatophytosis of nail   2. PVD (peripheral vascular disease) (HCC)   3. Callus of foot     Mycotic nails x10 were sharply debrided with sterile nail nippers and power debriding burr to decrease bulk and length.  Hyperkeratotic lesions x 4 were shaved with #312 blade.  3 of the lesions were proximal to the DIP joint   Return in about 3 months (around 08/22/2023) for RFC.   Clerance Lav, DPM, FACFAS [facility]     [address] [place], [place] [address]                Office [phone]  Fax [phone]

## 2023-05-27 DIAGNOSIS — Z96652 Presence of left artificial knee joint: Secondary | ICD-10-CM | POA: Diagnosis not present

## 2023-05-27 DIAGNOSIS — Z96642 Presence of left artificial hip joint: Secondary | ICD-10-CM | POA: Diagnosis not present

## 2023-05-28 DIAGNOSIS — Z6833 Body mass index (BMI) 33.0-33.9, adult: Secondary | ICD-10-CM | POA: Diagnosis not present

## 2023-05-28 DIAGNOSIS — E1165 Type 2 diabetes mellitus with hyperglycemia: Secondary | ICD-10-CM | POA: Diagnosis not present

## 2023-05-31 ENCOUNTER — Ambulatory Visit: Payer: PPO | Admitting: Podiatry

## 2023-06-04 DIAGNOSIS — M353 Polymyalgia rheumatica: Secondary | ICD-10-CM | POA: Diagnosis not present

## 2023-06-04 DIAGNOSIS — Z7952 Long term (current) use of systemic steroids: Secondary | ICD-10-CM | POA: Diagnosis not present

## 2023-06-04 DIAGNOSIS — M5136 Other intervertebral disc degeneration, lumbar region: Secondary | ICD-10-CM | POA: Diagnosis not present

## 2023-06-04 DIAGNOSIS — Z6833 Body mass index (BMI) 33.0-33.9, adult: Secondary | ICD-10-CM | POA: Diagnosis not present

## 2023-06-04 DIAGNOSIS — E669 Obesity, unspecified: Secondary | ICD-10-CM | POA: Diagnosis not present

## 2023-06-04 DIAGNOSIS — M1991 Primary osteoarthritis, unspecified site: Secondary | ICD-10-CM | POA: Diagnosis not present

## 2023-06-04 DIAGNOSIS — R21 Rash and other nonspecific skin eruption: Secondary | ICD-10-CM | POA: Diagnosis not present

## 2023-08-02 DIAGNOSIS — D519 Vitamin B12 deficiency anemia, unspecified: Secondary | ICD-10-CM | POA: Diagnosis not present

## 2023-08-21 ENCOUNTER — Ambulatory Visit: Payer: PPO | Admitting: Podiatry

## 2023-08-21 DIAGNOSIS — B351 Tinea unguium: Secondary | ICD-10-CM | POA: Diagnosis not present

## 2023-08-21 DIAGNOSIS — M79675 Pain in left toe(s): Secondary | ICD-10-CM

## 2023-08-21 DIAGNOSIS — M79674 Pain in right toe(s): Secondary | ICD-10-CM

## 2023-08-21 DIAGNOSIS — I739 Peripheral vascular disease, unspecified: Secondary | ICD-10-CM | POA: Diagnosis not present

## 2023-08-21 DIAGNOSIS — L84 Corns and callosities: Secondary | ICD-10-CM

## 2023-08-21 NOTE — Progress Notes (Signed)
    Subjective:  Patient ID: Nicholas Rowland, male    DOB: 11/28/1945,  MRN: [id number]  Nicholas Rowland presents to clinic today for:  Chief Complaint  Patient presents with   Nail Problem    Routine Foot Care-nail trim    Callouses    Callus trim right foot.    Patient notes nails are thick and elongated, causing pain in shoe gear when ambulating.  He has calluses right submet 1 and submet 3.  Also has a distal right 3rd toe corn.  PCP is Noni Saupe, MD.  Last seen 06/04/23.  Past Medical History:  Diagnosis Date   Anemia 06/19/2022   Barrett esophagus    Barrett's esophagus    GERD (gastroesophageal reflux disease)    History of bronchitis    Leukocytosis 06/29/2022   Osteoarthritis of left knee    Severe   No Known Allergies  Objective:  Nicholas Rowland is a pleasant 77 y.o. male in NAD. AAO x 3.  Vascular Examination: Patient has palpable DP pulse, absent PT pulse bilateral.  Delayed capillary refill bilateral toes.  Sparse digital hair bilateral.  Proximal to distal cooling WNL bilateral.    Dermatological Examination: Interspaces are clear with no open lesions noted bilateral.  Skin is shiny and atrophic bilateral.  Nails are 3-59mm thick, with yellowish/brown discoloration, subungual debris and distal onycholysis x10.  There is pain with compression of nails x10.  There are hyperkeratotic lesions noted right submet 1 & 5, and distal right 3rd toe .  Patient qualifies for at-risk foot care because of PVD .  Assessment/Plan: 1. Pain due to onychomycosis of toenails of both feet   2. PVD (peripheral vascular disease) (HCC)   3. Callus of foot     Mycotic nails x10 were sharply debrided with sterile nail nippers and power debriding burr to decrease bulk and length.  Hyperkeratotic lesions x3 were shaved with #312 blade. (Two are proximal to DIPJ)  Return in about 3 months (around 11/21/2023) for RFC.   Clerance Lav, DPM, FACFAS [facility]     [address] [place], [place] [address]                Office [phone]  Fax [phone]

## 2023-09-10 DIAGNOSIS — Z1331 Encounter for screening for depression: Secondary | ICD-10-CM | POA: Diagnosis not present

## 2023-09-10 DIAGNOSIS — Z0000 Encounter for general adult medical examination without abnormal findings: Secondary | ICD-10-CM | POA: Diagnosis not present

## 2023-09-10 DIAGNOSIS — Z1339 Encounter for screening examination for other mental health and behavioral disorders: Secondary | ICD-10-CM | POA: Diagnosis not present

## 2023-09-10 DIAGNOSIS — Z79899 Other long term (current) drug therapy: Secondary | ICD-10-CM | POA: Diagnosis not present

## 2023-09-10 DIAGNOSIS — K2270 Barrett's esophagus without dysplasia: Secondary | ICD-10-CM | POA: Diagnosis not present

## 2023-09-10 DIAGNOSIS — R739 Hyperglycemia, unspecified: Secondary | ICD-10-CM | POA: Diagnosis not present

## 2023-09-10 DIAGNOSIS — Z6833 Body mass index (BMI) 33.0-33.9, adult: Secondary | ICD-10-CM | POA: Diagnosis not present

## 2023-10-02 DIAGNOSIS — H25813 Combined forms of age-related cataract, bilateral: Secondary | ICD-10-CM | POA: Diagnosis not present

## 2023-10-02 DIAGNOSIS — H524 Presbyopia: Secondary | ICD-10-CM | POA: Diagnosis not present

## 2023-11-21 ENCOUNTER — Telehealth: Payer: Self-pay

## 2023-11-21 ENCOUNTER — Ambulatory Visit (INDEPENDENT_AMBULATORY_CARE_PROVIDER_SITE_OTHER): Payer: PPO | Admitting: Podiatry

## 2023-11-21 DIAGNOSIS — L84 Corns and callosities: Secondary | ICD-10-CM | POA: Diagnosis not present

## 2023-11-21 DIAGNOSIS — M79674 Pain in right toe(s): Secondary | ICD-10-CM | POA: Diagnosis not present

## 2023-11-21 DIAGNOSIS — B353 Tinea pedis: Secondary | ICD-10-CM

## 2023-11-21 DIAGNOSIS — M79675 Pain in left toe(s): Secondary | ICD-10-CM | POA: Diagnosis not present

## 2023-11-21 DIAGNOSIS — B351 Tinea unguium: Secondary | ICD-10-CM

## 2023-11-21 DIAGNOSIS — I739 Peripheral vascular disease, unspecified: Secondary | ICD-10-CM | POA: Diagnosis not present

## 2023-11-21 MED ORDER — OXISTAT 1 % EX LOTN: TOPICAL_LOTION | Freq: Two times a day (BID) | CUTANEOUS | 0 refills | Status: DC

## 2023-11-21 NOTE — Telephone Encounter (Signed)
 PA request received from [facility] drug for Oxistat 1% lotion. PA submitted through covermymeds and waiting on response.

## 2023-11-21 NOTE — Progress Notes (Signed)
    Subjective:  Patient ID: Nicholas Rowland, male    DOB: December 23, 1945,  MRN: [id number]  Nicholas Rowland presents to clinic today for:  Chief Complaint  Patient presents with   [facility]    RFC not diabetic,    Patient notes nails are thick and elongated, causing pain in shoe gear when ambulating.  Patient also has painful corns and calluses bilateral.  PCP is Dottie Norleen PHEBE PONCE, MD. date last seen was 08/02/2023  Past Medical History:  Diagnosis Date   Anemia 06/19/2022   Barrett esophagus    Barrett's esophagus    GERD (gastroesophageal reflux disease)    History of bronchitis    Leukocytosis 06/29/2022   Osteoarthritis of left knee    Severe    No Known Allergies  Objective:  Nicholas Rowland is a pleasant 78 y.o. male in NAD. AAO x 3.  Vascular Examination: Patient has palpable DP pulse, absent PT pulse bilateral.  Delayed capillary refill bilateral toes.  Sparse digital hair bilateral.  Proximal to distal cooling WNL bilateral.    Dermatological Examination: Interspaces are clear with no open lesions noted bilateral.  Skin is shiny and atrophic bilateral.  Nails are 3-74mm thick, with yellowish/brown discoloration, subungual debris and distal onycholysis x10.  There is pain with compression of nails x10.  There are hyperkeratotic lesions noted bilateral submet 1, right submet 3, right submet 5.  The interspaces on the right foot are macerated with small crack in the skin first interspace and localized erythema within the interspaces.  This is consistent with tinea pedis.  Patient qualifies for at-risk foot care because of PVD.  Assessment/Plan: 1. Pain due to onychomycosis of toenails of both feet   2. Tinea pedis of right foot   3. Pre-ulcerative corn or callous   4. PVD (peripheral vascular disease) (HCC)     Meds ordered this encounter  Medications   oxiconazole (OXISTAT ) 1 % lotion    Sig: Apply topically 2 (two) times daily. Apply between toes twice daily    Dispense:   30 mL    Refill:  0   Mycotic nails x10 were sharply debrided with sterile nail nippers and power debriding burr to decrease bulk and length.  Hyperkeratotic lesions x 4 were shaved with #312 blade.  A prescription for Oxistat  lotion 1% was sent to his pharmacy to apply to the interdigital spaces on the right foot twice daily.  Patient was instructed to call our office if this is too expensive and we can send in a different prescription.  Castellani's paint was applied to the interspaces during his visit today.   Return in about 3 months (around 02/19/2024) for RFC.   Awanda CHARM Imperial, DPM, FACFAS [facility]     [address] [place], [place] [address]                Office [phone]  Fax [phone]

## 2023-11-22 ENCOUNTER — Other Ambulatory Visit: Payer: Self-pay | Admitting: Podiatry

## 2023-11-22 ENCOUNTER — Encounter: Payer: Self-pay | Admitting: Podiatry

## 2023-11-22 MED ORDER — KETOCONAZOLE 2 % EX CREA: 1.0000 | TOPICAL_CREAM | Freq: Two times a day (BID) | CUTANEOUS | 2 refills | Status: DC

## 2023-11-22 NOTE — Progress Notes (Signed)
 Received a notice from [employer] that the prescription Oxistat  cream was not covered by his insurance plan.  I had messaged the patient earlier this morning asking if he would like this switched out to something covered by his insurance.  I did not know if the patient had pick the medication up and pay full price or not.  It is the end of the day on Friday and I will go ahead and send in prescription ketoconazole  2% cream to apply to the affected skin twice daily.  Will have our medical assistant reach out to the patient and just let him know if he had not picked up the Oxistat  that he can go ahead and pick up the ketoconazole  and start with this instead.

## 2023-11-22 NOTE — Telephone Encounter (Signed)
 Spoke with patient, he will pick up the new rx today.

## 2024-02-19 ENCOUNTER — Ambulatory Visit: Payer: PPO | Admitting: Podiatry

## 2024-02-19 DIAGNOSIS — M79674 Pain in right toe(s): Secondary | ICD-10-CM | POA: Diagnosis not present

## 2024-02-19 DIAGNOSIS — M79675 Pain in left toe(s): Secondary | ICD-10-CM

## 2024-02-19 DIAGNOSIS — I739 Peripheral vascular disease, unspecified: Secondary | ICD-10-CM

## 2024-02-19 DIAGNOSIS — B351 Tinea unguium: Secondary | ICD-10-CM | POA: Diagnosis not present

## 2024-02-19 DIAGNOSIS — L84 Corns and callosities: Secondary | ICD-10-CM

## 2024-02-19 NOTE — Progress Notes (Unsigned)
       Subjective:  Patient ID: Nicholas Rowland, male    DOB: 11-Nov-1946,  MRN: [id number]  [name] presents to clinic today for:  Chief Complaint  Patient presents with   [facility]    RFC with callous care.    Patient notes nails are thick and elongated, causing pain in shoe gear when ambulating.  He has painful calluses right submet 3 and submet 5, and left submet 1 as well as the distal aspect of the right second toe.  PCP is Noni Saupe, MD. last seen around 09/10/2023  Past Medical History:  Diagnosis Date   Anemia 06/19/2022   Barrett esophagus    Barrett's esophagus    GERD (gastroesophageal reflux disease)    History of bronchitis    Leukocytosis 06/29/2022   Osteoarthritis of left knee    Severe    No Known Allergies  Objective:  Nicholas Rowland is a pleasant 78 y.o. male in NAD. AAO x 3.  Vascular Examination: Patient has palpable DP pulse, absent PT pulse bilateral.  Delayed capillary refill bilateral toes.  Sparse digital hair bilateral.  Proximal to distal cooling WNL bilateral.    Dermatological Examination: Interspaces are clear with no open lesions noted bilateral.  Skin is shiny and atrophic bilateral.  Nails are 3-78mm thick, with yellowish/brown discoloration, subungual debris and distal onycholysis x10.  There is pain with compression of nails x10.  There are hyperkeratotic lesions noted right submet 3, right submet 5, left submet 1, distal right second toe.  Patient qualifies for at-risk foot care because of PVD.  Assessment/Plan: 1. Pain due to onychomycosis of toenails of both feet   2. Callus of foot   3. PVD (peripheral vascular disease) (HCC)    Mycotic nails x10 were sharply debrided with sterile nail nippers and power debriding burr to decrease bulk and length.  Hyperkeratotic lesions x 4 were shaved with #312 blade.  One of the lesions is distal to the toe DIPJ, but 3 are still billable as they are proximal to this level   Return in  about 3 months (around 05/20/2024) for RFC.   Clerance Lav, DPM, FACFAS [facility]     [address] [place], [place] [address]                Office [phone]  Fax [phone]

## 2024-05-27 ENCOUNTER — Ambulatory Visit: Admitting: Podiatry

## 2024-05-27 DIAGNOSIS — I739 Peripheral vascular disease, unspecified: Secondary | ICD-10-CM | POA: Diagnosis not present

## 2024-05-27 DIAGNOSIS — B351 Tinea unguium: Secondary | ICD-10-CM

## 2024-05-27 DIAGNOSIS — L84 Corns and callosities: Secondary | ICD-10-CM

## 2024-05-27 DIAGNOSIS — M79675 Pain in left toe(s): Secondary | ICD-10-CM | POA: Diagnosis not present

## 2024-05-27 DIAGNOSIS — M79674 Pain in right toe(s): Secondary | ICD-10-CM | POA: Diagnosis not present

## 2024-05-27 NOTE — Progress Notes (Signed)
       Subjective:  Patient ID: Nicholas Rowland, male    DOB: 21-Aug-1946,  MRN: [id number]  Nicholas Rowland presents to clinic today for:  Chief Complaint  Patient presents with   [facility]    RFC with callous. Not diabetic and no anti coag. He came off all meds.    Patient notes nails are thick and elongated, causing pain in shoe gear when ambulating.  He has painful calluses right submet 3, plantar medial right heel, bilateral submet 1 and right distal third toe.  PCP is Dottie Norleen PHEBE PONCE, MD. last seen around 03/30/2024  Past Medical History:  Diagnosis Date   Anemia 06/19/2022   Barrett esophagus    Barrett's esophagus    GERD (gastroesophageal reflux disease)    History of bronchitis    Leukocytosis 06/29/2022   Osteoarthritis of left knee    Severe    No Known Allergies  Objective:  Nicholas Rowland is a pleasant 78 y.o. male in NAD. AAO x 3.  Vascular Examination: Patient has palpable DP pulse, absent PT pulse bilateral.  Delayed capillary refill bilateral toes.  Sparse digital hair bilateral.  Proximal to distal cooling WNL bilateral.    Dermatological Examination: Interspaces are clear with no open lesions noted bilateral.  Skin is shiny and atrophic bilateral.  Nails are 3-15mm thick, with yellowish/brown discoloration, subungual debris and distal onycholysis x10.  There is pain with compression of nails x10.  There are hyperkeratotic lesions noted right submet 3, plantar medial right heel, bilateral submet 1 and right distal third toe.  Patient qualifies for at-risk foot care because of PVD.  Assessment/Plan: 1. Pain due to onychomycosis of toenails of both feet   2. Callus of foot   3. PVD (peripheral vascular disease) (HCC)    Mycotic nails x10 were sharply debrided with sterile nail nippers and power debriding burr to decrease bulk and length.  Hyperkeratotic lesions x 5 were shaved with #312 blade.  One of the skin lesions is at the tip of the toe, and therefore  unbillable to insurance.  Return in about 3 months (around 08/27/2024) for RFC.   Nicholas Rowland, DPM, FACFAS [facility]     [address] [place], [place] [address]                Office [phone]  Fax [phone]

## 2024-08-26 ENCOUNTER — Ambulatory Visit: Admitting: Podiatry

## 2024-08-26 DIAGNOSIS — B351 Tinea unguium: Secondary | ICD-10-CM | POA: Diagnosis not present

## 2024-08-26 DIAGNOSIS — M79675 Pain in left toe(s): Secondary | ICD-10-CM | POA: Diagnosis not present

## 2024-08-26 DIAGNOSIS — L84 Corns and callosities: Secondary | ICD-10-CM

## 2024-08-26 DIAGNOSIS — I739 Peripheral vascular disease, unspecified: Secondary | ICD-10-CM | POA: Diagnosis not present

## 2024-08-26 DIAGNOSIS — M79674 Pain in right toe(s): Secondary | ICD-10-CM

## 2024-08-26 NOTE — Progress Notes (Signed)
       Subjective:  Patient ID: Nicholas Rowland, male    DOB: November 19, 1946,  MRN: [id number]  Nicholas Rowland presents to clinic today for:  Chief Complaint  Patient presents with   [facility]    RFC with callous Not diabetic and currently no  medications.    Patient notes nails are thick and elongated, causing pain in shoe gear when ambulating.  He has painful calluses right submet 3, right submet 5, bilateral submet 1 and right distal third toe.  PCP is Dottie Norleen PHEBE PONCE, MD. last seen around 03/30/2024  Past Medical History:  Diagnosis Date   Anemia 06/19/2022   Barrett esophagus    Barrett's esophagus    GERD (gastroesophageal reflux disease)    History of bronchitis    Leukocytosis 06/29/2022   Osteoarthritis of left knee    Severe    No Known Allergies  Objective:  Nicholas Rowland is a pleasant 78 y.o. male in NAD. AAO x 3.  Vascular Examination: Patient has palpable DP pulse, absent PT pulse bilateral.  Delayed capillary refill bilateral toes.  Sparse digital hair bilateral.  Proximal to distal cooling WNL bilateral.    Dermatological Examination: Interspaces are clear with no open lesions noted bilateral.  Skin is shiny and atrophic bilateral.  Nails are 3-57mm thick, with yellowish/brown discoloration, subungual debris and distal onycholysis x10.  There is pain with compression of nails x10.  There are hyperkeratotic lesions noted right submet 3, right submet 5, bilateral submet 1 and right distal third toe.  Patient qualifies for at-risk foot care because of PVD.  Assessment/Plan: 1. Pain due to onychomycosis of toenails of both feet   2. Callus of foot   3. PVD (peripheral vascular disease)    Mycotic nails x10 were sharply debrided with sterile nail nippers and power debriding burr to decrease bulk and length.  Hyperkeratotic lesions x 5 were shaved with #312 blade.  4 of the 5 lesions are proximal to the toes.  Return in about 3 months (around 11/26/2024) for  RFC.   Awanda CHARM Imperial, DPM, FACFAS [facility]     [address] [place], [place] [address]                Office [phone]  Fax [phone]

## 2024-11-26 ENCOUNTER — Ambulatory Visit: Admitting: Podiatry

## 2024-11-26 DIAGNOSIS — M79675 Pain in left toe(s): Secondary | ICD-10-CM

## 2024-11-26 DIAGNOSIS — L84 Corns and callosities: Secondary | ICD-10-CM

## 2024-11-26 DIAGNOSIS — M79674 Pain in right toe(s): Secondary | ICD-10-CM

## 2024-11-26 DIAGNOSIS — I739 Peripheral vascular disease, unspecified: Secondary | ICD-10-CM | POA: Diagnosis not present

## 2024-11-26 DIAGNOSIS — B351 Tinea unguium: Secondary | ICD-10-CM

## 2024-11-26 NOTE — Progress Notes (Signed)
" °   °  °  Subjective:  Patient ID: Nicholas Rowland, male    DOB: 12/12/1945,  MRN: [id number]  Nicholas Rowland presents to clinic today for:  Chief Complaint  Patient presents with   [facility]    Not diabetic, no anticoag, nail trim and has callous BL medial aspect near 1st hallux.    Patient notes nails are thick and elongated, causing pain in shoe gear when ambulating.  He has painful calluses right submet 3, bilateral submet 1 and right distal third toe.  PCP is Dottie Norleen PHEBE PONCE, MD. last seen around 09/09/2024  Past Medical History:  Diagnosis Date   Anemia 06/19/2022   Barrett esophagus    Barrett's esophagus    GERD (gastroesophageal reflux disease)    History of bronchitis    Leukocytosis 06/29/2022   Osteoarthritis of left knee    Severe    No Known Allergies  Objective:  Nicholas Rowland is a pleasant 79 y.o. male in NAD. AAO x 3.  Vascular Examination: Patient has palpable DP pulse, absent PT pulse bilateral.  Delayed capillary refill bilateral toes.  Sparse digital hair bilateral.  Proximal to distal cooling WNL bilateral.    Dermatological Examination: Interspaces are clear with no open lesions noted bilateral.  Skin is shiny and atrophic bilateral.  Nails are 3-71mm thick, with yellowish/brown discoloration, subungual debris and distal onycholysis x10.  There is pain with compression of nails x10.  There are hyperkeratotic lesions noted right submet 3, bilateral submet 1 and right distal third toe.  Patient qualifies for at-risk foot care because of PVD.  Assessment/Plan: 1. Pain due to onychomycosis of toenails of both feet   2. Callus of foot   3. PVD (peripheral vascular disease)    Mycotic nails x10 were sharply debrided with sterile nail nippers and power debriding burr to decrease bulk and length.  Hyperkeratotic lesions x 4 were shaved with #312 blade.  3 of the 4 lesions are proximal to the toes.  Return in about 3 months (around 02/24/2025) for RFC.   Awanda CHARM Imperial, DPM, FACFAS [facility]     [address] [place], [place] [address]                Office [phone]  Fax [phone] "

## 2025-03-03 ENCOUNTER — Ambulatory Visit: Admitting: Podiatry
